# Patient Record
Sex: Male | Born: 1960
Health system: Southern US, Community
[De-identification: ages and names within clinical notes are randomized; demographics above are authoritative.]

## PROBLEM LIST (undated history)

## (undated) DIAGNOSIS — E7800 Pure hypercholesterolemia, unspecified: Secondary | ICD-10-CM

## (undated) DIAGNOSIS — H539 Unspecified visual disturbance: Secondary | ICD-10-CM

## (undated) DIAGNOSIS — I10 Essential (primary) hypertension: Secondary | ICD-10-CM

## (undated) HISTORY — PX: GANGLION CYST EXCISION: SHX1691

## (undated) HISTORY — DX: Essential (primary) hypertension: I10

## (undated) HISTORY — DX: Unspecified visual disturbance: H53.9

## (undated) HISTORY — PX: OTHER SURGICAL HISTORY: SHX169

## (undated) HISTORY — DX: Pure hypercholesterolemia, unspecified: E78.00

---

## 2003-12-04 ENCOUNTER — Ambulatory Visit: Payer: Self-pay | Admitting: Gastroenterology

## 2004-03-18 ENCOUNTER — Ambulatory Visit: Payer: Self-pay | Admitting: Internal Medicine

## 2004-04-01 ENCOUNTER — Ambulatory Visit: Payer: Self-pay

## 2004-04-08 ENCOUNTER — Ambulatory Visit: Payer: Self-pay | Admitting: Internal Medicine

## 2004-04-15 ENCOUNTER — Ambulatory Visit (HOSPITAL_COMMUNITY): Admission: RE | Admit: 2004-04-15 | Discharge: 2004-04-16 | Payer: Self-pay | Admitting: Internal Medicine

## 2004-04-15 ENCOUNTER — Ambulatory Visit: Payer: Self-pay | Admitting: Internal Medicine

## 2004-04-19 ENCOUNTER — Ambulatory Visit: Payer: Self-pay | Admitting: Cardiology

## 2004-04-19 ENCOUNTER — Ambulatory Visit: Payer: Self-pay

## 2004-04-25 ENCOUNTER — Ambulatory Visit: Payer: Self-pay | Admitting: Cardiology

## 2004-06-03 ENCOUNTER — Ambulatory Visit: Payer: Self-pay | Admitting: Internal Medicine

## 2004-06-03 ENCOUNTER — Ambulatory Visit (HOSPITAL_COMMUNITY): Admission: RE | Admit: 2004-06-03 | Discharge: 2004-06-03 | Payer: Self-pay | Admitting: Internal Medicine

## 2005-05-03 ENCOUNTER — Ambulatory Visit (HOSPITAL_COMMUNITY): Admission: RE | Admit: 2005-05-03 | Discharge: 2005-05-03 | Payer: Self-pay | Admitting: Internal Medicine

## 2005-10-03 ENCOUNTER — Encounter (HOSPITAL_COMMUNITY): Admission: RE | Admit: 2005-10-03 | Discharge: 2005-10-14 | Payer: Self-pay | Admitting: Psychiatry

## 2005-10-17 ENCOUNTER — Encounter (HOSPITAL_COMMUNITY): Admission: RE | Admit: 2005-10-17 | Discharge: 2005-11-16 | Payer: Self-pay | Admitting: Psychiatry

## 2005-11-17 ENCOUNTER — Ambulatory Visit: Admission: RE | Admit: 2005-11-17 | Discharge: 2005-11-17 | Payer: Self-pay | Admitting: Psychiatry

## 2007-01-17 ENCOUNTER — Encounter: Payer: Self-pay | Admitting: Family Medicine

## 2007-01-29 ENCOUNTER — Ambulatory Visit (HOSPITAL_COMMUNITY): Admission: RE | Admit: 2007-01-29 | Discharge: 2007-01-29 | Payer: Self-pay | Admitting: Family Medicine

## 2007-07-17 ENCOUNTER — Emergency Department (HOSPITAL_COMMUNITY): Admission: EM | Admit: 2007-07-17 | Discharge: 2007-07-17 | Payer: Self-pay | Admitting: Emergency Medicine

## 2007-07-19 ENCOUNTER — Ambulatory Visit (HOSPITAL_COMMUNITY): Payer: Self-pay | Admitting: *Deleted

## 2007-07-19 ENCOUNTER — Encounter (HOSPITAL_COMMUNITY): Admission: RE | Admit: 2007-07-19 | Discharge: 2007-08-18 | Payer: Self-pay | Admitting: *Deleted

## 2007-07-22 ENCOUNTER — Ambulatory Visit (HOSPITAL_COMMUNITY): Payer: Self-pay | Admitting: Family Medicine

## 2009-06-18 ENCOUNTER — Encounter (INDEPENDENT_AMBULATORY_CARE_PROVIDER_SITE_OTHER): Payer: Self-pay

## 2010-02-15 NOTE — Letter (Signed)
Summary: Recall, Screening Colonoscopy Only  [facility]  [address]   [place], [place] [address]   Phone: [phone]  Fax: [phone]    June 18, 2009  ZYDEN SUMAN [address] [place], [place]  [address] [birth year]   Dear Mr. Kovich,   Our records indicate it is time to schedule your colonoscopy.   Please call our office at [phone] and ask for the nurse.   Thank you,    Hendricks Limes, LPN Cloria Spring, LPN  [facility] Ph: [phone]   Fax: [phone]

## 2010-02-17 NOTE — Letter (Signed)
Summary: Historic Patient File  Historic Patient File   Imported By: Lind Guest 01/21/2010 16:26:04  _____________________________________________________________________  External Attachment:    Type:   Image     Comment:   External Document

## 2010-06-03 NOTE — Discharge Summary (Signed)
NAME:  Ruben Allen, Ruben Allen NO.:  [id number]   MEDICAL RECORD NO.:  [id number]          PATIENT TYPE:  OIB   LOCATION:  2896                         FACILITY:  [facility]   PHYSICIAN:  Maple Mirza, P.A. DATE OF BIRTH:  March 30, 1960   DATE OF ADMISSION:  04/15/2004  DATE OF DISCHARGE:  04/16/2004                                 DISCHARGE SUMMARY   PRIMARY CARE PHYSICIAN:  Madelin Rear. Fusco, MD   DISCHARGE DIAGNOSES:  1.  Status post radiofrequency catheter ablation of a AVRT with left lateral      accessory pathway.  2.  History of long-term (greater than 10 years) history of      tachypalpitations becoming more prolonged and more frequent with      symptoms of chest discomfort radiating to the neck and arms, shortness      of breath, and presyncope.  3.  Mid systolic click with occasional 2-D echocardiogram.   SECONDARY DIAGNOSES:  1.  Excision of breast mass, 1979.  2.  Resection of ganglion cyst on the wrist.  3.  Borderline hypertension.   PROCEDURES:  1.  2-D echocardiogram on March 18, 2004, with ejection fraction normal,      heart size normal, mitral valve leaflets are normal with no stenosis,      flutter, or prolapse.  2.  Stress Myoview on April 01, 2004, with ejection fraction of 54%. No      ischemia.  3.  On April 15, 2004, radiofrequency catheter ablation of AVRT with left      lateral accessory pathway, Dr. Duke Salvia.   DISCHARGE DISPOSITION:  The patient discharged on post procedure day #1  after observation. He has had no complaints in the post procedure period. He  has maintained sinus rhythm. His groin checks have been normal without  swelling, hematoma, drainage, or pain. The patient's vital signs have been  stable achieving 96% oxygen saturation on room air. The patient discharged  on his preadmission medications. He will continue on medications he was  taking prior to this admission.  He has a follow-up appointment with Dr.  Graciela Husbands at  [facility], [facility] office, [address], on  Tuesday, May 17, 2004, at 9:30 a.m.  He is asked not to drive for the next  two days, to avoid heavy lifting or straining for the next two weeks. He may  shower. He is to call [facility] at [phone] if he experiences pain,  swelling, or drainage at his catheterization sites.   DISCHARGE DIET:  Low sodium, low cholesterol diet.   BRIEF HISTORY:  Ruben Allen is a 50 year old male who has had recurrent  episodes of tachypalpitations with abrupt onset and offset. These have been  going on for about ten years and have become increasingly more symptomatic.  His symptoms include chest discomfort with radiation of the discomfort into  the neck and arms, and exertional shortness of breath. They occur 20 to 50  times per years, some times lasting one hour and some times 48 hours. He has  presyncopal feelings of lightheadedness with this. They  are FROG positive  and diuretic negative. He does not notice any effect with caffeine and does  not use over-the-counter cold medications. Electrocardiogram on the day of  examination, March 3rd, showed sinus rhythm at 70 beats per minute.  Impression is that the patient has a supraventricular tachycardia with  probable AV node re-entry based on his symptoms, since the symptoms are  becoming more pronounced and the episodes more frequent and prolonged,  definitive therapy makes good sense. Potential benefits and risks of  radiofrequency catheter ablation will be discussed with the patient and he  wishes to proceed with this study. Prior to this study an echocardiogram  will be obtained to image the mitral valve and a stress test will be  performed.   HOSPITAL COURSE:  The patient presents electively on March 31st after  undergoing an echocardiogram which showed a mitral valve with normal  function, a stress test with no ischemia, and an ejection fraction of 54%.  He underwent  radiofrequency catheter ablation of an AV re-entry tachycardia  with left lateral pathway. Dr. Graciela Husbands did successful radiofrequency catheter  ablation of this. The patient is ready for discharge on post procedure day  #1 with no post procedure complications.      GM/MEDQ  D:  04/15/2004  T:  04/15/2004  Job:  [id number]   cc:   Duke Salvia, M.D.   Madelin Rear. Sherwood Gambler, MD  [address]  [place]  [place] [address]  Fax: [phone]

## 2010-06-03 NOTE — Op Note (Signed)
NAME:  Ruben Allen, Ruben Allen              ACCOUNT NO.:  [id number]   MEDICAL RECORD NO.:  [id number]          PATIENT TYPE:  AMB   LOCATION:  DAY                           FACILITY:  [facility]   PHYSICIAN:  Lionel December, M.D.    DATE OF BIRTH:  1960-07-14   DATE OF PROCEDURE:  06/03/2004  DATE OF DISCHARGE:                                 OPERATIVE REPORT   PROCEDURE:  Colonoscopy.   INDICATIONS:  [name] is a 50 year old Caucasian male with intermittent  hematochezia felt to be secondary to  hemorrhoids.  However, he also has  irregular bowel movements.  Some days he has diarrhea.  Family history is  significant for colon carcinoma in his father, who died because of it at age  71.  His sister, age 65, recently had colonoscopy and had 10 polyps removed.  The procedure risks were reviewed the patient, informed consent was  obtained.   PREMEDICATION:  Demerol 50 mg IV, Versed 6 mg IV in divided dose.   FINDINGS:  Procedure performed in endoscopy suite.  The patient's vital  signs and O2 saturation were monitored during procedure and remained stable.  The patient was placed in the left lateral recumbent position and rectal  examination performed.  No abnormality noted on external or digital exam.  The Olympus video scope was placed in the rectum and advanced under vision  into sigmoid colon and beyond.  Preparation was satisfactory.  The scope was  passed to the cecum, which was identified by appendiceal orifice and  ileocecal valve.  Pictures taken for the record.  A short segment of TI was  also examined and was normal.  Colonic mucosa was examined for the second  time on the way out, and there were no polyps and/or tumor masses.  A single  small diverticulum and was noted at distal sigmoid colon.  Rectal mucosa was  normal.  The scope was retroflexed to examine the anorectal junction and  small hemorrhoids were noted below the dentate line.  Endoscope was  straightened and withdrawn.  The  patient tolerated the procedure well.   FINAL DIAGNOSIS:  1.  Single small diverticulum at sigmoid colon and small external      hemorrhoids, otherwise normal examination.  2.  Suspect hematochezia secondary hemorrhoids and feels he has irritable      bowel syndrome.   RECOMMENDATIONS:  1.  High-fiber diet.  2.  Citrucel 4 g q.d. or equivalent.  3.  The patient should consider having a repeat exam in five years from now.      NR/MEDQ  D:  06/03/2004  T:  06/03/2004  Job:  [id number]   cc:   Madelin Rear. Sherwood Gambler, MD  [address]  [place]  [place] [address]  Fax: [phone]

## 2010-07-04 ENCOUNTER — Other Ambulatory Visit (HOSPITAL_COMMUNITY): Payer: Self-pay | Admitting: Pulmonary Disease

## 2010-07-04 DIAGNOSIS — M549 Dorsalgia, unspecified: Secondary | ICD-10-CM

## 2010-07-05 ENCOUNTER — Ambulatory Visit (HOSPITAL_COMMUNITY)
Admission: RE | Admit: 2010-07-05 | Discharge: 2010-07-05 | Disposition: A | Discharge status: Home / Self Care | Payer: 59 | Source: Ambulatory Visit | Attending: Pulmonary Disease | Admitting: Pulmonary Disease

## 2010-07-05 DIAGNOSIS — M5020 Other cervical disc displacement, unspecified cervical region: Secondary | ICD-10-CM | POA: Insufficient documentation

## 2010-07-05 DIAGNOSIS — M79609 Pain in unspecified limb: Secondary | ICD-10-CM | POA: Insufficient documentation

## 2010-07-05 DIAGNOSIS — M542 Cervicalgia: Secondary | ICD-10-CM | POA: Insufficient documentation

## 2010-07-05 DIAGNOSIS — M5380 Other specified dorsopathies, site unspecified: Secondary | ICD-10-CM | POA: Insufficient documentation

## 2010-07-05 DIAGNOSIS — M549 Dorsalgia, unspecified: Secondary | ICD-10-CM | POA: Insufficient documentation

## 2010-07-05 DIAGNOSIS — R209 Unspecified disturbances of skin sensation: Secondary | ICD-10-CM | POA: Insufficient documentation

## 2011-01-04 ENCOUNTER — Other Ambulatory Visit (HOSPITAL_COMMUNITY): Payer: Self-pay | Admitting: Pulmonary Disease

## 2011-01-04 ENCOUNTER — Ambulatory Visit (HOSPITAL_COMMUNITY)
Admission: RE | Admit: 2011-01-04 | Discharge: 2011-01-04 | Disposition: A | Discharge status: Home / Self Care | Payer: 59 | Source: Ambulatory Visit | Attending: Pulmonary Disease | Admitting: Pulmonary Disease

## 2011-01-04 DIAGNOSIS — M79609 Pain in unspecified limb: Secondary | ICD-10-CM | POA: Insufficient documentation

## 2011-01-04 DIAGNOSIS — M79601 Pain in right arm: Secondary | ICD-10-CM

## 2012-04-03 ENCOUNTER — Ambulatory Visit (HOSPITAL_COMMUNITY)
Admission: RE | Admit: 2012-04-03 | Discharge: 2012-04-03 | Disposition: A | Discharge status: Home / Self Care | Payer: Managed Care, Other (non HMO) | Source: Ambulatory Visit | Attending: Family Medicine | Admitting: Family Medicine

## 2012-04-03 ENCOUNTER — Other Ambulatory Visit (HOSPITAL_COMMUNITY): Payer: Self-pay | Admitting: Family Medicine

## 2012-04-03 DIAGNOSIS — R059 Cough, unspecified: Secondary | ICD-10-CM | POA: Insufficient documentation

## 2012-04-03 DIAGNOSIS — R5381 Other malaise: Secondary | ICD-10-CM | POA: Insufficient documentation

## 2013-01-30 ENCOUNTER — Encounter (INDEPENDENT_AMBULATORY_CARE_PROVIDER_SITE_OTHER): Payer: Self-pay | Admitting: *Deleted

## 2013-01-31 ENCOUNTER — Ambulatory Visit (HOSPITAL_COMMUNITY)
Admission: RE | Admit: 2013-01-31 | Discharge: 2013-01-31 | Disposition: A | Discharge status: Home / Self Care | Payer: BC Managed Care – PPO | Source: Ambulatory Visit | Attending: Family Medicine | Admitting: Family Medicine

## 2013-01-31 ENCOUNTER — Other Ambulatory Visit (HOSPITAL_COMMUNITY): Payer: Self-pay | Admitting: Family Medicine

## 2013-01-31 DIAGNOSIS — M545 Low back pain, unspecified: Secondary | ICD-10-CM

## 2013-01-31 DIAGNOSIS — M533 Sacrococcygeal disorders, not elsewhere classified: Secondary | ICD-10-CM | POA: Insufficient documentation

## 2013-02-13 ENCOUNTER — Telehealth (INDEPENDENT_AMBULATORY_CARE_PROVIDER_SITE_OTHER): Payer: Self-pay | Admitting: *Deleted

## 2013-02-13 ENCOUNTER — Ambulatory Visit (INDEPENDENT_AMBULATORY_CARE_PROVIDER_SITE_OTHER): Payer: BC Managed Care – PPO | Admitting: Internal Medicine

## 2013-02-13 ENCOUNTER — Encounter (INDEPENDENT_AMBULATORY_CARE_PROVIDER_SITE_OTHER): Payer: Self-pay | Admitting: Internal Medicine

## 2013-02-13 ENCOUNTER — Other Ambulatory Visit (INDEPENDENT_AMBULATORY_CARE_PROVIDER_SITE_OTHER): Payer: Self-pay | Admitting: *Deleted

## 2013-02-13 VITALS — BP 128/66 | HR 70 | Temp 97.4°F | Ht 68.0 in | Wt 175.2 lb

## 2013-02-13 DIAGNOSIS — Z800 Family history of malignant neoplasm of digestive organs: Secondary | ICD-10-CM

## 2013-02-13 DIAGNOSIS — Z1211 Encounter for screening for malignant neoplasm of colon: Secondary | ICD-10-CM

## 2013-02-13 DIAGNOSIS — R195 Other fecal abnormalities: Secondary | ICD-10-CM

## 2013-02-13 DIAGNOSIS — K625 Hemorrhage of anus and rectum: Secondary | ICD-10-CM

## 2013-02-13 MED ORDER — PEG-KCL-NACL-NASULF-NA ASC-C 100 G PO SOLR: 1.0000 | Freq: Once | ORAL | Status: DC

## 2013-02-13 NOTE — Telephone Encounter (Signed)
Patient needs movi prep 

## 2013-02-13 NOTE — Patient Instructions (Signed)
Colonoscopy with Dr. Rehman. The risks and benefits such as perforation, bleeding, and infection were reviewed with the patient and is agreeable. 

## 2013-02-13 NOTE — Progress Notes (Signed)
Subjective:     Patient ID: Ruben Allen, male   DOB: [birth year], 53 y.o.   MRN: [id number]  HPI Referred to our office by Dr. Gerarda Fraction, [place] for rectal bleeding, change in stool caliber, and strong family hx of colon cancer. He has had rectal bleeding a couple of times.  He also says his stools are completely flat. Sometimes his stools look like rabbit pellets. He has not had a normal stools in a couple of months.  Sometimes he has lower abdomin pain with his stools.  He has a lot of gas. Symptoms x 3-4 months. Appetite is okay. He has been taking Prednisone. He had some weight loss 10-15 pounds over a few months. Weight loss was unintentional.   He usually has a BM 2-3 days. He use to have a BM on a daily basis.  No recent rectal bleeding. No melena.  01/21/2013 H and H 16.1 and 45.0, MCV 85.1, Platelet Ct 33   Father deceased from colon cancer at age 60. Patient's older brother age 68s (1/2 brother) just diagnosed with colon cancer. Fecal occult blood negative in Dr. Nolon Rod office 01/21/2013 06/03/2004 Dr. Laural Golden Colonoscopy: rectal bleeding, family hx of colon cancer: FINAL DIAGNOSIS:  1. Single small diverticulum at sigmoid colon and small external  hemorrhoids, otherwise normal examination.  2. Suspect hematochezia secondary hemorrhoids and feels he has irritable  bowel syndrome.    Review of Systems see hpi Past Medical History  Diagnosis Date  . High cholesterol   . Hypertension     x 5 yrs   No past surgical history on file. No Known Allergies No current outpatient prescriptions on file prior to visit.   No current facility-administered medications on file prior to visit.   Current outpatient prescriptions:aspirin 81 MG tablet, Take 81 mg by mouth daily., Disp: , Rfl: ;  cholecalciferol (VITAMIN D) 1000 UNITS tablet, Take 1,000 Units by mouth 2 (two) times daily before a meal., Disp: , Rfl: ;  citalopram (CELEXA) 20 MG tablet, Take 20 mg by mouth daily., Disp: ,  Rfl: ;  hydrochlorothiazide (HYDRODIURIL) 25 MG tablet, Take 25 mg by mouth daily., Disp: , Rfl:  loratadine (CLARITIN) 10 MG tablet, Take 10 mg by mouth daily., Disp: , Rfl: ;  pravastatin (PRAVACHOL) 20 MG tablet, Take 20 mg by mouth daily., Disp: , Rfl: ;  vitamin C (ASCORBIC ACID) 500 MG tablet, Take 500 mg by mouth daily., Disp: , Rfl:    Married. Works at [employer] in [place] Two children in good health.      Objective:   Physical Exam  Filed Vitals:   02/13/13 1015  BP: 128/66  Pulse: 70  Temp: 97.4 F (36.3 C)  Height: 5\' 8"  (1.727 m)  Weight: 175 lb 3.2 oz (79.47 kg)  Alert and oriented. Skin warm and dry. Oral mucosa is moist.   . Sclera anicteric, conjunctivae is pink. Thyroid not enlarged. No cervical lymphadenopathy. Lungs clear. Heart regular rate and rhythm.  Abdomen is soft. Bowel sounds are positive. No hepatomegaly. No abdominal masses felt. No tenderness.  No edema to lower extremities.  Rectal deferred. Patient will be scheduled for a colonoscopy      Assessment:    Change in stool, Rectal bleeding. Strong family hx of colon cancer. Rectal carcinoma needs to be ruled out.     Plan:    Colonoscopy with Dr. Laural Golden. The risks and benefits such as perforation, bleeding, and infection were reviewed with the patient and  is agreeable.

## 2013-02-19 ENCOUNTER — Ambulatory Visit (HOSPITAL_COMMUNITY)
Admission: RE | Admit: 2013-02-19 | Discharge: 2013-02-19 | Disposition: A | Discharge status: Home / Self Care | Payer: BC Managed Care – PPO | Source: Ambulatory Visit | Attending: Internal Medicine | Admitting: Internal Medicine

## 2013-02-19 ENCOUNTER — Encounter (HOSPITAL_COMMUNITY)
Admission: RE | Disposition: A | Discharge status: Home / Self Care | Payer: Self-pay | Source: Ambulatory Visit | Attending: Internal Medicine

## 2013-02-19 ENCOUNTER — Encounter (HOSPITAL_COMMUNITY): Payer: Self-pay | Admitting: *Deleted

## 2013-02-19 DIAGNOSIS — D126 Benign neoplasm of colon, unspecified: Secondary | ICD-10-CM | POA: Insufficient documentation

## 2013-02-19 DIAGNOSIS — K625 Hemorrhage of anus and rectum: Secondary | ICD-10-CM

## 2013-02-19 DIAGNOSIS — I10 Essential (primary) hypertension: Secondary | ICD-10-CM | POA: Insufficient documentation

## 2013-02-19 DIAGNOSIS — K644 Residual hemorrhoidal skin tags: Secondary | ICD-10-CM | POA: Insufficient documentation

## 2013-02-19 DIAGNOSIS — K921 Melena: Secondary | ICD-10-CM | POA: Insufficient documentation

## 2013-02-19 DIAGNOSIS — R198 Other specified symptoms and signs involving the digestive system and abdomen: Secondary | ICD-10-CM

## 2013-02-19 DIAGNOSIS — Z79899 Other long term (current) drug therapy: Secondary | ICD-10-CM | POA: Insufficient documentation

## 2013-02-19 DIAGNOSIS — IMO0002 Reserved for concepts with insufficient information to code with codable children: Secondary | ICD-10-CM | POA: Insufficient documentation

## 2013-02-19 DIAGNOSIS — Z7982 Long term (current) use of aspirin: Secondary | ICD-10-CM | POA: Insufficient documentation

## 2013-02-19 DIAGNOSIS — Z800 Family history of malignant neoplasm of digestive organs: Secondary | ICD-10-CM

## 2013-02-19 DIAGNOSIS — R195 Other fecal abnormalities: Secondary | ICD-10-CM

## 2013-02-19 DIAGNOSIS — K5730 Diverticulosis of large intestine without perforation or abscess without bleeding: Secondary | ICD-10-CM | POA: Insufficient documentation

## 2013-02-19 HISTORY — PX: COLONOSCOPY: SHX5424

## 2013-02-19 SURGERY — COLONOSCOPY
Anesthesia: Moderate Sedation

## 2013-02-19 MED ORDER — SODIUM CHLORIDE 0.9 % IV SOLN
INTRAVENOUS | Status: DC
  Administered 2013-02-19: 1000 mL via INTRAVENOUS

## 2013-02-19 MED ORDER — DICYCLOMINE HCL 10 MG PO CAPS: 10.0000 mg | ORAL_CAPSULE | Freq: Two times a day (BID) | ORAL | Status: DC

## 2013-02-19 MED ORDER — MEPERIDINE HCL 50 MG/ML IJ SOLN
INTRAMUSCULAR | Status: DC | PRN
  Administered 2013-02-19 (×2): 25 mg via INTRAVENOUS

## 2013-02-19 MED ORDER — MIDAZOLAM HCL 5 MG/5ML IJ SOLN
INTRAMUSCULAR | Status: AC
  Filled 2013-02-19: qty 10

## 2013-02-19 MED ORDER — SIMETHICONE 40 MG/0.6ML PO SUSP
ORAL | Status: DC | PRN
  Administered 2013-02-19: 16:00:00

## 2013-02-19 MED ORDER — MEPERIDINE HCL 50 MG/ML IJ SOLN
INTRAMUSCULAR | Status: DC
Start: 2013-02-19 — End: 2013-02-19
  Filled 2013-02-19: qty 1

## 2013-02-19 MED ORDER — PSYLLIUM 28 % PO PACK: 1.0000 | PACK | Freq: Every day | ORAL | Status: DC

## 2013-02-19 MED ORDER — MIDAZOLAM HCL 5 MG/5ML IJ SOLN
INTRAMUSCULAR | Status: DC | PRN
  Administered 2013-02-19 (×2): 3 mg via INTRAVENOUS
  Administered 2013-02-19 (×2): 2 mg via INTRAVENOUS

## 2013-02-19 NOTE — Op Note (Signed)
COLONOSCOPY PROCEDURE REPORT  PATIENT:  Ruben Allen  MR#:  [id number] Birthdate:  08/14/1960, 53 y.o., male Endoscopist:  Dr. Rogene Houston, MD Referred By:  Dr. Sherrilee Gilles. Gerarda Fraction, MD Procedure Date: 02/19/2013  Procedure:   Colonoscopy  Indications:  Patient is 53 year old Caucasian male who presents with change in caliber of stools irregular bowel movements and hematochezia. Family history significant for colon carcinoma in his father at age 57 and half brother in his early 36s. Patient's last colonoscopy was in May 2006.  Informed Consent:  The procedure and risks were reviewed with the patient and informed consent was obtained.  Medications:  Demerol 50 mg IV Versed 10 mg IV  Description of procedure:  After a digital rectal exam was performed, that colonoscope was advanced from the anus through the rectum and colon to the area of the cecum, ileocecal valve and appendiceal orifice. The cecum was deeply intubated. These structures were well-seen and photographed for the record. From the level of the cecum and ileocecal valve, the scope was slowly and cautiously withdrawn. The mucosal surfaces were carefully surveyed utilizing scope tip to flexion to facilitate fold flattening as needed. The scope was pulled down into the rectum where a thorough exam including retroflexion was performed.  Findings:   Prep excellent. Small polyps ablated via cold biopsy from mid sigmoid colon. Few small diverticula at sigmoid colon. Normal rectal mucosa. Small hemorrhoids below the dentate line.   Therapeutic/Diagnostic Maneuvers Performed:  See above  Complications:  None  Cecal Withdrawal Time:  8 minutes  Impression:  Examination performed to cecum. Small sigmoid colon ablated via cold biopsy. Mild sigmoid colon diverticulosis. Small external hemorrhoids.  Comment; He has irritable bowel syndrome and hematochezia secondary to hemorrhoids.  Recommendations:  Standard instructions  given. Dicyclomine 10 mg by mouth twice a day. Metamucil 4 g by mouth each bedtime. I will contact patient with biopsy results and further recommendations.  Chemika Nightengale U  02/19/2013 4:11 PM  CC: Dr. Glo Herring., MD & Dr. Rayne Du ref. provider found

## 2013-02-19 NOTE — Discharge Instructions (Signed)
Resume usual medications and high fiber diet. Dicyclomine 10 mg by mouth before breakfast and evening meal daily. Metamucil 4 g by mouth daily at bedtime. No driving for 24 hours. Physician will contact you with biopsy results. Colonoscopy, Care After Refer to this sheet in the next few weeks. These instructions provide you with information on caring for yourself after your procedure. Your health care provider may also give you more specific instructions. Your treatment has been planned according to current medical practices, but problems sometimes occur. Call your health care provider if you have any problems or questions after your procedure. WHAT TO EXPECT AFTER THE PROCEDURE  After your procedure, it is typical to have the following:  A small amount of blood in your stool.  Moderate amounts of gas and mild abdominal cramping or bloating. HOME CARE INSTRUCTIONS  Do not drive, operate machinery, or sign important documents for 24 hours.  You may shower and resume your regular physical activities, but move at a slower pace for the first 24 hours.  Take frequent rest periods for the first 24 hours.  Walk around or put a warm pack on your abdomen to help reduce abdominal cramping and bloating.  Drink enough fluids to keep your urine clear or pale yellow.  You may resume your normal diet as instructed by your health care provider. Avoid heavy or fried foods that are hard to digest.  Avoid drinking alcohol for 24 hours or as instructed by your health care provider.  Only take over-the-counter or prescription medicines as directed by your health care provider.  If a tissue sample (biopsy) was taken during your procedure:  Do not take aspirin or blood thinners for 7 days, or as instructed by your health care provider.  Do not drink alcohol for 7 days, or as instructed by your health care provider.  Eat soft foods for the first 24 hours. SEEK MEDICAL CARE IF: You have persistent  spotting of blood in your stool 2 3 days after the procedure. SEEK IMMEDIATE MEDICAL CARE IF:  You have more than a small spotting of blood in your stool.  You pass large blood clots in your stool.  Your abdomen is swollen (distended).  You have nausea or vomiting.  You have a fever.  You have increasing abdominal pain that is not relieved with medicine. Document Released: 08/17/2003 Document Revised: 10/23/2012 Document Reviewed: 09/09/2012 [facility] Patient Information 2014 [place].

## 2013-02-19 NOTE — H&P (Signed)
Ruben Allen is an 53 y.o. male.   Chief Complaint: Patient is here for EGD. HPI: Patient is 53 year old Caucasian male sent change in his bowel habits. He is passing flat stools and other times he passes fecal balls. He has had few episodes of bright red blood per rectum his bowel movements. He has good appetite. He has lost 15 pounds in the last few months involuntarily. Patient's last colonoscopy was in May 2006 revealing single diverticulum in sigmoid colon external hemorrhoids. Family history significant for colon carcinoma in his father at age 29 and died of metastatic disease year later. He has a half brother(same father) was diagnosed with colon carcinoma. He is in his early 29s.  Past Medical History  Diagnosis Date  . High cholesterol   . Hypertension     x 5 yrs    Past Surgical History  Procedure Laterality Date  . Cardiac cath    . Thumb surgery    . Ganglion cyst excision Left     History reviewed. No pertinent family history. Social History:  reports that he has quit smoking. His smoking use included Cigarettes. He has a 25 pack-year smoking history. He quit smokeless tobacco use about 13 months ago. He reports that he drinks alcohol. He reports that he does not use illicit drugs.  Allergies: Not on File  Medications Prior to Admission  Medication Sig Dispense Refill  . aspirin 81 MG tablet Take 81 mg by mouth daily.      . Cholecalciferol (VITAMIN D) 2000 UNITS tablet Take 2,000 Units by mouth daily.      . citalopram (CELEXA) 20 MG tablet Take 20 mg by mouth daily.      . hydrochlorothiazide (HYDRODIURIL) 25 MG tablet Take 25 mg by mouth daily.      Marland Kitchen loratadine (CLARITIN) 10 MG tablet Take 10 mg by mouth daily.      . pravastatin (PRAVACHOL) 20 MG tablet Take 20 mg by mouth daily.      . predniSONE (DELTASONE) 10 MG tablet See admin instructions. Take 5 tablets day for 3 days, 4 tablets for 3 days, 3 tablets for 3 days, 2 tablets for 3 days then 1 tablet for 3  days      . vitamin C (ASCORBIC ACID) 500 MG tablet Take 500 mg by mouth daily.      Marland Kitchen levofloxacin (LEVAQUIN) 500 MG tablet Take 1 tablet by mouth daily. For 14 days        No results found for this or any previous visit (from the past 48 hour(s)). No results found.  ROS  Blood pressure 120/82, pulse 79, temperature 98.2 F (36.8 C), temperature source Oral, SpO2 98.00%. Physical Exam  Constitutional: He appears well-developed and well-nourished.  HENT:  Mouth/Throat: Oropharynx is clear and moist.  Eyes: Conjunctivae are normal. No scleral icterus.  Neck: No thyromegaly present.  Cardiovascular: Normal rate, regular rhythm and normal heart sounds.   No murmur heard. Respiratory: Effort normal and breath sounds normal.  GI: Soft. He exhibits no distension and no mass. Tenderness: mild tenderness at LLQ.  Musculoskeletal: He exhibits no edema.  Lymphadenopathy:    He has no cervical adenopathy.  Neurological: He is alert.  Skin: Skin is warm and dry.     Assessment/Plan Change in bowel habits and hematochezia. Family history of colon carcinoma in her father and half-brother. Diagnostic colonoscopy.  Haunani Dickard U 02/19/2013, 3:33 PM

## 2013-02-24 ENCOUNTER — Encounter (HOSPITAL_COMMUNITY): Payer: Self-pay | Admitting: Internal Medicine

## 2013-03-01 ENCOUNTER — Other Ambulatory Visit: Payer: Self-pay | Admitting: Internal Medicine

## 2013-03-01 MED ORDER — MERCAPTOPURINE 50 MG PO TABS: 100.0000 mg | ORAL_TABLET | Freq: Every day | ORAL | Status: DC

## 2013-03-03 ENCOUNTER — Telehealth (INDEPENDENT_AMBULATORY_CARE_PROVIDER_SITE_OTHER): Payer: Self-pay | Admitting: *Deleted

## 2013-03-03 NOTE — Telephone Encounter (Signed)
Ruben Allen had a TCS a couple of weeks ago. The medication Dr. Laural Golden put him on seems to be helping. His cramps have gotten better. He was advised to start taking fiber which has him going to the bathroom with lose stools 4 times daily. Would like to know if he soul continue taking the fiber? His return phone number is [phone].

## 2013-03-05 NOTE — Telephone Encounter (Signed)
Per Dr.Rehman the patient should take 3-4 grams of fiber daily. Patient was called and made aware.

## 2013-03-05 NOTE — Telephone Encounter (Signed)
I called Ruben Allen and ask that he take the 3 4- grams of fiber daily per Dr.Rehman. He states that he is taking Metamucil 0.52 grams , 1 by mouth at night. He says that the instructions on the bottle was to to take 5 by mouth daily. He goes on to say that Dr.Rehman ask that he take the packet form. Ruben Allen says that on yesterday he started rectal bleeding with each BM, it os fresh red blood and he feels that it may be a tear. Patient is going 4-5 times daily to the bath room with loose stool.  Patient was advised that Dr.Rehman would be made aware and we would call him back with his instructions. Patient uses the [facility] in [place].

## 2013-03-05 NOTE — Telephone Encounter (Signed)
Forwarded to Dr.Rehman to address. 

## 2013-03-06 NOTE — Telephone Encounter (Signed)
Patient's call return yesterday. He was advised to stop taking fiber. Will try probiotic daily for one month and call with progress report. Office visit in 6 months with me.

## 2013-03-10 ENCOUNTER — Encounter (INDEPENDENT_AMBULATORY_CARE_PROVIDER_SITE_OTHER): Payer: Self-pay | Admitting: *Deleted

## 2013-03-10 NOTE — Telephone Encounter (Signed)
LM for Damyan to return the call.

## 2013-03-12 NOTE — Telephone Encounter (Signed)
Apt has been scheduled for 09/09/13 with Dr. Laural Golden.

## 2013-06-24 ENCOUNTER — Encounter (INDEPENDENT_AMBULATORY_CARE_PROVIDER_SITE_OTHER): Payer: Self-pay

## 2013-08-24 ENCOUNTER — Other Ambulatory Visit (HOSPITAL_COMMUNITY): Payer: Self-pay | Admitting: Internal Medicine

## 2013-08-25 NOTE — Telephone Encounter (Signed)
Wife calls in and states he longer has insurance and would like # 60 with at least 2 refills.  I told her that should not be an issue if so we would call back.  Uses [facility]

## 2013-08-25 NOTE — Telephone Encounter (Signed)
May fill with 5 additional refills.

## 2013-09-08 ENCOUNTER — Telehealth (INDEPENDENT_AMBULATORY_CARE_PROVIDER_SITE_OTHER): Payer: Self-pay | Admitting: *Deleted

## 2013-09-08 NOTE — Telephone Encounter (Signed)
Chart noted and apt has been canceled

## 2013-09-08 NOTE — Telephone Encounter (Signed)
Wife stated she got call to remind of the appt on 8/25 and he will not be able to come because of ins.  She will call back to reschedule once he gets it

## 2013-09-09 ENCOUNTER — Ambulatory Visit (INDEPENDENT_AMBULATORY_CARE_PROVIDER_SITE_OTHER): Payer: BC Managed Care – PPO | Admitting: Internal Medicine

## 2015-05-11 ENCOUNTER — Ambulatory Visit (HOSPITAL_COMMUNITY)
Admission: RE | Admit: 2015-05-11 | Discharge: 2015-05-11 | Disposition: A | Discharge status: Home / Self Care | Payer: 59 | Source: Ambulatory Visit | Attending: Registered Nurse | Admitting: Registered Nurse

## 2015-05-11 ENCOUNTER — Other Ambulatory Visit (HOSPITAL_COMMUNITY): Payer: Self-pay | Admitting: Registered Nurse

## 2015-05-11 DIAGNOSIS — M47894 Other spondylosis, thoracic region: Secondary | ICD-10-CM | POA: Insufficient documentation

## 2015-05-11 DIAGNOSIS — M549 Dorsalgia, unspecified: Secondary | ICD-10-CM | POA: Diagnosis present

## 2016-03-10 ENCOUNTER — Other Ambulatory Visit (HOSPITAL_COMMUNITY): Payer: Self-pay | Admitting: Family Medicine

## 2016-03-10 DIAGNOSIS — R319 Hematuria, unspecified: Secondary | ICD-10-CM

## 2016-03-10 DIAGNOSIS — R109 Unspecified abdominal pain: Secondary | ICD-10-CM

## 2016-04-20 ENCOUNTER — Encounter (INDEPENDENT_AMBULATORY_CARE_PROVIDER_SITE_OTHER): Payer: Self-pay | Admitting: Internal Medicine

## 2016-05-02 ENCOUNTER — Encounter (INDEPENDENT_AMBULATORY_CARE_PROVIDER_SITE_OTHER): Payer: Self-pay

## 2016-05-02 ENCOUNTER — Ambulatory Visit (INDEPENDENT_AMBULATORY_CARE_PROVIDER_SITE_OTHER): Payer: Managed Care, Other (non HMO) | Admitting: Internal Medicine

## 2016-05-02 ENCOUNTER — Encounter (INDEPENDENT_AMBULATORY_CARE_PROVIDER_SITE_OTHER): Payer: Self-pay | Admitting: Internal Medicine

## 2016-05-02 VITALS — BP 158/90 | HR 56 | Temp 97.6°F | Ht 68.0 in | Wt 178.0 lb

## 2016-05-02 DIAGNOSIS — R3129 Other microscopic hematuria: Secondary | ICD-10-CM

## 2016-05-02 DIAGNOSIS — K5730 Diverticulosis of large intestine without perforation or abscess without bleeding: Secondary | ICD-10-CM

## 2016-05-02 DIAGNOSIS — K4030 Unilateral inguinal hernia, with obstruction, without gangrene, not specified as recurrent: Secondary | ICD-10-CM

## 2016-05-02 LAB — CBC WITH DIFFERENTIAL/PLATELET
BASOS ABS: 0 {cells}/uL (ref 0–200)
Basophils Relative: 0 %
EOS PCT: 3 %
Eosinophils Absolute: 273 cells/uL (ref 15–500)
HCT: 45.4 % (ref 38.5–50.0)
Hemoglobin: 15.5 g/dL (ref 13.2–17.1)
LYMPHS PCT: 22 %
Lymphs Abs: 2002 cells/uL (ref 850–3900)
MCH: 30.4 pg (ref 27.0–33.0)
MCHC: 34.1 g/dL (ref 32.0–36.0)
MCV: 89 fL (ref 80.0–100.0)
MPV: 12 fL (ref 7.5–12.5)
Monocytes Absolute: 637 cells/uL (ref 200–950)
Monocytes Relative: 7 %
Neutro Abs: 6188 cells/uL (ref 1500–7800)
Neutrophils Relative %: 68 %
Platelets: 252 10*3/uL (ref 140–400)
RBC: 5.1 MIL/uL (ref 4.20–5.80)
RDW: 13.1 % (ref 11.0–15.0)
WBC: 9.1 10*3/uL (ref 3.8–10.8)

## 2016-05-02 LAB — HEPATIC FUNCTION PANEL
ALBUMIN: 4.4 g/dL (ref 3.6–5.1)
ALT: 18 U/L (ref 9–46)
AST: 14 U/L (ref 10–35)
Alkaline Phosphatase: 56 U/L (ref 40–115)
Bilirubin, Direct: 0.1 mg/dL (ref <=0.2)
Indirect Bilirubin: 0.3 mg/dL (ref 0.2–1.2)
Total Bilirubin: 0.4 mg/dL (ref 0.2–1.2)
Total Protein: 6.8 g/dL (ref 6.1–8.1)

## 2016-05-02 LAB — URINALYSIS
Bilirubin Urine: NEGATIVE
GLUCOSE, UA: NEGATIVE
KETONES UR: NEGATIVE
Leukocytes, UA: NEGATIVE
Nitrite: NEGATIVE
PH: 6 (ref 5.0–8.0)
Protein, ur: NEGATIVE
SPECIFIC GRAVITY, URINE: 1.007 (ref 1.001–1.035)

## 2016-05-02 NOTE — Patient Instructions (Addendum)
OV in 1 year. If pain occurs, call our office. Labs today

## 2016-05-02 NOTE — Progress Notes (Signed)
Subjective:    Patient ID: Ruben Allen, male    DOB: [birth year], 56 y.o.   MRN: [id number]  HPI Referred by Dr. Gerarda Fraction for diverticulosis and inguinal hernia.  He says he has had pain in his rt flank for about a year or so. He says sometimes the pain is sharp an sometimes it is nagging. Nothing seems to provoke it.  He denies any fever. Appetite is good. No weight loss.  He has a BM x 1 a day and sometimes more.  No melena or BRRB.  He states sometimes he has some tenderness LLQ on palpatation.  Sometimes he has pain in his rt flank on movement.   He says he has had blood in his urine in the past (on urinalysis).   02/19/2013 Colonoscopy: Dr. Laural Golden: change in caliber of stools, irregular bowel movements and hematochezia. Family hx of colon cancer in father age 40 and half brother in his early 1s.  Examination performed to cecum. Small sigmoid colon ablated via cold biopsy. Mild sigmoid colon diverticulosis. Small external hemorrhoids.  Polyp is hyperplastic. Next colonoscopy in 5 years because of family history of CRC.  03/15/2016 Ct abdomen/pelvis with CM:   Result Narrative  CLINICAL DATA:Intermittent stabbing right flank pain for 1 month, microscopic hematuria       1. There is no evidence of nephrolithiasis. No hydronephrosis or hydroureter. No calcified ureteral calculi. 2. No calcified calculi are noted within urinary bladder. 3. Normal appendix.No pericecal inflammation. 4. No small bowel or colonic obstruction. 5. Scattered diverticula are noted descending colon and proximal sigmoid colon. No evidence of acute colitis or acute diverticulitis. 6. There is a left inguinal scrotal canal hernia containing fat measures 1.7 cm without evidence of acute complication.      Review of Systems Past Medical History:  Diagnosis Date  . High cholesterol   . Hypertension    x 5 yrs    Past Surgical History:  Procedure Laterality Date  . cardiac cath    .  COLONOSCOPY N/A 02/19/2013   Procedure: COLONOSCOPY;  Surgeon: Rogene Houston, MD;  Location: [facility];  Service: Endoscopy;  Laterality: N/A;  145-moved to 320 Ann to notify pt  . GANGLION CYST EXCISION Left   . Thumb surgery      No Known Allergies  Current Outpatient Prescriptions on File Prior to Visit  Medication Sig Dispense Refill  . aspirin 81 MG tablet Take 81 mg by mouth daily.    . citalopram (CELEXA) 20 MG tablet Take 20 mg by mouth daily.    Marland Kitchen dicyclomine (BENTYL) 10 MG capsule TAKE ONE CAPSULE BY MOUTH TWICE DAILY BEFORE MEAL(S) 60 capsule 5  . hydrochlorothiazide (HYDRODIURIL) 25 MG tablet Take 25 mg by mouth daily.    Marland Kitchen loratadine (CLARITIN) 10 MG tablet Take 10 mg by mouth daily.    . pravastatin (PRAVACHOL) 20 MG tablet Take 20 mg by mouth daily.    . vitamin C (ASCORBIC ACID) 500 MG tablet Take 500 mg by mouth daily.    . Cholecalciferol (VITAMIN D) 2000 UNITS tablet Take 2,000 Units by mouth daily.     No current facility-administered medications on file prior to visit.        Objective:   Physical Exam Blood pressure (!) 158/90, pulse (!) 56, temperature 97.6 F (36.4 C), height 5\' 8"  (1.727 m), weight 178 lb (80.7 kg).  Alert and oriented. Skin warm and dry. Oral mucosa is moist.   . Sclera  anicteric, conjunctivae is pink. Thyroid not enlarged. No cervical lymphadenopathy. Lungs clear. Heart regular rate and rhythm.  Abdomen is soft. Bowel sounds are positive. No hepatomegaly. No abdominal masses felt.Slight tenderness to LLQ on palpation (deep). .  No edema to lower extremities.  .      Assessment & Plan:  Diverticulosis. Am going to get a CBC, Hepatic   Hematuria: Urinalysis Left inguinal hernia. If symptomatic may need to see surgeon of choice.  OV in 1 year.

## 2017-02-07 ENCOUNTER — Other Ambulatory Visit (HOSPITAL_COMMUNITY): Payer: Self-pay | Admitting: Family Medicine

## 2017-02-07 ENCOUNTER — Ambulatory Visit (HOSPITAL_COMMUNITY)
Admission: RE | Admit: 2017-02-07 | Discharge: 2017-02-07 | Disposition: A | Discharge status: Home / Self Care | Payer: Managed Care, Other (non HMO) | Source: Ambulatory Visit | Attending: Family Medicine | Admitting: Family Medicine

## 2017-02-07 DIAGNOSIS — M5136 Other intervertebral disc degeneration, lumbar region: Secondary | ICD-10-CM | POA: Insufficient documentation

## 2017-02-07 DIAGNOSIS — R109 Unspecified abdominal pain: Secondary | ICD-10-CM | POA: Diagnosis not present

## 2017-02-07 DIAGNOSIS — R319 Hematuria, unspecified: Secondary | ICD-10-CM | POA: Diagnosis present

## 2017-02-07 DIAGNOSIS — K5730 Diverticulosis of large intestine without perforation or abscess without bleeding: Secondary | ICD-10-CM | POA: Insufficient documentation

## 2017-05-03 ENCOUNTER — Ambulatory Visit (INDEPENDENT_AMBULATORY_CARE_PROVIDER_SITE_OTHER): Payer: Managed Care, Other (non HMO) | Admitting: Internal Medicine

## 2017-05-08 ENCOUNTER — Ambulatory Visit (INDEPENDENT_AMBULATORY_CARE_PROVIDER_SITE_OTHER): Payer: Managed Care, Other (non HMO) | Admitting: Internal Medicine

## 2017-08-12 ENCOUNTER — Encounter (HOSPITAL_COMMUNITY): Payer: Self-pay | Admitting: *Deleted

## 2017-08-12 ENCOUNTER — Emergency Department (HOSPITAL_COMMUNITY): Payer: Managed Care, Other (non HMO)

## 2017-08-12 ENCOUNTER — Emergency Department (HOSPITAL_COMMUNITY)
Admission: EM | Admit: 2017-08-12 | Discharge: 2017-08-12 | Disposition: A | Discharge status: Home Health | Payer: Managed Care, Other (non HMO) | Attending: Emergency Medicine | Admitting: Emergency Medicine

## 2017-08-12 DIAGNOSIS — Z79899 Other long term (current) drug therapy: Secondary | ICD-10-CM | POA: Insufficient documentation

## 2017-08-12 DIAGNOSIS — R200 Anesthesia of skin: Secondary | ICD-10-CM

## 2017-08-12 DIAGNOSIS — Z7982 Long term (current) use of aspirin: Secondary | ICD-10-CM | POA: Insufficient documentation

## 2017-08-12 DIAGNOSIS — F17210 Nicotine dependence, cigarettes, uncomplicated: Secondary | ICD-10-CM | POA: Diagnosis not present

## 2017-08-12 DIAGNOSIS — R202 Paresthesia of skin: Secondary | ICD-10-CM | POA: Diagnosis present

## 2017-08-12 DIAGNOSIS — I10 Essential (primary) hypertension: Secondary | ICD-10-CM | POA: Diagnosis not present

## 2017-08-12 DIAGNOSIS — M792 Neuralgia and neuritis, unspecified: Secondary | ICD-10-CM | POA: Diagnosis not present

## 2017-08-12 MED ORDER — HYDROCODONE-ACETAMINOPHEN 5-325 MG PO TABS: 1.0000 | ORAL_TABLET | ORAL | 0 refills | Status: DC | PRN

## 2017-08-12 NOTE — Discharge Instructions (Addendum)
Your CT scan is negative today for any evidence of a stroke, tumor or other source of your symptoms.  As discussed, you may need an MRI if your symptoms persist or do not improve with the valtrex and gabapentin ( or you do not develop the expected rash which would confirm shingles).  You may take the hydrocodone prescribed for pain relief in addition to the medicines you are taking if needed for severe pain.  This will make you drowsy - do not drive within 4 hours of taking this medication.

## 2017-08-12 NOTE — ED Triage Notes (Signed)
Pt with sharp stabbing HA's and was seen by PCP on Friday, pt began to have numbness to left lip Friday, yesterday morning numbness got worse to left side of face, denies any other numbness elsewhere, denies weakness.

## 2017-08-12 NOTE — ED Notes (Signed)
Patient transported to CT 

## 2017-08-12 NOTE — ED Provider Notes (Signed)
[facility] EMERGENCY DEPARTMENT Provider Note   CSN: [id number] Arrival date & time: 08/12/17  2355     History   Chief Complaint Chief Complaint  Patient presents with  . Numbness    HPI Ruben Allen is a 57 y.o. male with a history of htn and hypercholesterolemia was seen by his pcp 2 days ago due to a one week history of intermittent stabs of pain which had become progressive, starting in his right ear with radiation across his parietal scalp to beyond midline when severe radiating to his posterior left eye area, worsened in the evening hours, lancing in character.  He was started on valtrex and gabapentin for presumptive shingles (no rash yet).  He also had a small area of numbness to his left lower lip which started 2 days ago and has progressed, now including his left tongue and cheek area "feels like novocaine".  He denies fevers, chills, current headache or pain, as he states he generally wakes up feeling ok, the pain starts in the afternoon and gets severe in the evening.  He denies any other focal weakness or numbness.  Denies vision or hearing changes, no dizziness, no difficulty with speech or swallowing.  He is unsure if the medicines prescribed are making a difference. He was nauseated last night when his pain was severe, not currently.  The history is provided by the patient.    Past Medical History:  Diagnosis Date  . High cholesterol   . Hypertension    x 5 yrs    Patient Active Problem List   Diagnosis Date Noted  . Change in stool 02/13/2013  . Rectal bleeding 02/13/2013  . Family hx of colon cancer 02/13/2013    Past Surgical History:  Procedure Laterality Date  . cardiac cath    . COLONOSCOPY N/A 02/19/2013   Procedure: COLONOSCOPY;  Surgeon: Rogene Houston, MD;  Location: [facility];  Service: Endoscopy;  Laterality: N/A;  145-moved to 320 Ann to notify pt  . GANGLION CYST EXCISION Left   . Thumb surgery          Home Medications    Prior  to Admission medications   Medication Sig Start Date End Date Taking? Authorizing Provider  amLODipine (NORVASC) 2.5 MG tablet Take 2.5 mg by mouth daily.   Yes [provider]  aspirin 81 MG tablet Take 81 mg by mouth daily.   Yes [provider]  citalopram (CELEXA) 20 MG tablet Take 20 mg by mouth daily.   Yes [provider]  dicyclomine (BENTYL) 10 MG capsule TAKE ONE CAPSULE BY MOUTH TWICE DAILY BEFORE MEAL(S)   Yes Rehman, Najeeb U, MD  gabapentin (NEURONTIN) 300 MG capsule Take 1 capsule by mouth 3 (three) times daily. 08/10/17  Yes [provider]  hydrochlorothiazide (HYDRODIURIL) 25 MG tablet Take 25 mg by mouth daily.   Yes [provider]  ibuprofen (ADVIL,MOTRIN) 200 MG tablet Take 200 mg by mouth daily.   Yes [provider]  loratadine (CLARITIN) 10 MG tablet Take 10 mg by mouth daily.   Yes [provider]  pravastatin (PRAVACHOL) 40 MG tablet Take 40 mg by mouth daily. 07/24/17  Yes [provider]  valACYclovir (VALTREX) 500 MG tablet Take 1 tablet by mouth 2 (two) times daily. Starting 08/10/2017 x 7 days. 08/10/17  Yes [provider]  vitamin C (ASCORBIC ACID) 500 MG tablet Take 500 mg by mouth daily.   Yes [provider]  HYDROcodone-acetaminophen (NORCO/VICODIN) 5-325 MG tablet Take 1 tablet by mouth every 4 (four) hours as needed. 08/12/17   Evalee Jefferson, PA-C    Family History History reviewed. No pertinent family history.  Social History Social History   Tobacco Use  . Smoking status: Current Every Day Smoker    Packs/day: 1.00    Years: 25.00    Pack years: 25.00    Types: Cigarettes  . Smokeless tobacco: Former Systems developer    Quit date: 01/16/2012  . Tobacco comment: quit a couple of years after smoking 65yrs.  Substance Use Topics  . Alcohol use: Yes    Comment: weekends  . Drug use: No     Allergies   Patient has no known allergies.   Review of Systems Review of  Systems  Constitutional: Negative for chills and fever.  HENT: Positive for ear pain. Negative for congestion and sore throat.   Eyes: Positive for pain.  Respiratory: Negative for chest tightness and shortness of breath.   Cardiovascular: Negative for chest pain.  Gastrointestinal: Positive for nausea. Negative for abdominal pain.  Genitourinary: Negative.   Musculoskeletal: Negative for arthralgias, joint swelling and neck pain.  Skin: Negative.  Negative for rash and wound.  Neurological: Positive for numbness and headaches. Negative for dizziness, weakness and light-headedness.  Psychiatric/Behavioral: Negative.      Physical Exam Updated Vital Signs BP 124/77   Pulse 66   Temp 97.6 F (36.4 C) (Oral)   Resp 15   Ht 5\' 8"  (1.727 m)   Wt 79.4 kg (175 lb)   SpO2 97%   BMI 26.61 kg/m   Physical Exam  Constitutional: He is oriented to person, place, and time. He appears well-developed and well-nourished.  HENT:  Head: Normocephalic and atraumatic.  Right Ear: External ear normal. Tympanic membrane is bulging.  Left Ear: External ear normal.  Nose: Nose normal.  Mouth/Throat: Uvula is midline, oropharynx is clear and moist and mucous membranes are normal. No trismus in the jaw. No oropharyngeal exudate, posterior oropharyngeal edema or posterior oropharyngeal erythema.  TM's clear, right TM with loss of landmarks, no effusion, no erythema.  Eyes: Conjunctivae are normal.  Neck: Normal range of motion.  Cardiovascular: Normal rate, regular rhythm, normal heart sounds and intact distal pulses.  Pulmonary/Chest: Effort normal and breath sounds normal. He has no wheezes.  Abdominal: Soft. Bowel sounds are normal. There is no tenderness.  Musculoskeletal: Normal range of motion.  Neurological: He is alert and oriented to person, place, and time. A sensory deficit is present. No cranial nerve deficit.  No facial droop.  Decreased sensation to fine touch left chin and left  perioral. Smile symmetric.  No tongue deviation.  Equal grip strength. No pronator drift. Cranial nerves otherwise without deficit.  Skin: Skin is warm and dry.  Psychiatric: He has a normal mood and affect.  Nursing note and vitals reviewed.    ED Treatments / Results  Labs (all labs ordered are listed, but only abnormal results are displayed) Labs Reviewed - No data to display  EKG EKG Interpretation  Date/Time:  Sunday August 12 2017 09:50:54 EDT Ventricular Rate:  69 PR Interval:    QRS Duration: 110 QT Interval:  429 QTC Calculation: 460 R Axis:   67 Text Interpretation:  Sinus rhythm Borderline T abnormalities, anterior leads Nonspecific T wave abnormality aVL When compared with ECG of 04/16/2004 Nonspecific T wave abnormality aVL is now Present Otherwise no significant change Confirmed by Francine Graven [phone]) on 08/12/2017 10:13:45  AM   Radiology Ct Head Wo Contrast  Result Date: 08/12/2017 CLINICAL DATA:  57 year old male with acute headache and lip numbness for 2 days. EXAM: CT HEAD WITHOUT CONTRAST TECHNIQUE: Contiguous axial images were obtained from the base of the skull through the vertex without intravenous contrast. COMPARISON:  05/03/2005 brain MR FINDINGS: Brain: No evidence of acute infarction, hemorrhage, hydrocephalus, extra-axial collection or mass lesion/mass effect. Vascular: No hyperdense vessel or unexpected calcification. Skull: Normal. Negative for fracture or focal lesion. Sinuses/Orbits: No acute finding. Other: None. IMPRESSION: Unremarkable noncontrast head CT. Electronically Signed   By: Margarette Canada M.D.   On: 08/12/2017 11:07    Procedures Procedures (including critical care time)  Medications Ordered in ED Medications - No data to display   Initial Impression / Assessment and Plan / ED Course  I have reviewed the triage vital signs and the nursing notes.  Pertinent labs & imaging results that were available during my care of the patient were  reviewed by me and considered in my medical decision making (see chart for details).     Pt with a one week history of right sided ear and head pain suggesting neuropathic pain with left sided perioral numbness. No mass effect, no subacute cva on CT imaging.  No neuro deficits on exam except for localized numbness.  Advised to continue meds prescribed by his pcp, added hydrocodone for pain relief.  Advised close f/u with pcp this week for persistent or worsened sx. Ddx including atypical new onset migraine, Bells palsy or trigeminal neuralgia, but again, atypical.  Likely shingles pending rash.   The patient appears reasonably screened and/or stabilized for discharge and I doubt any other medical condition or other [facility] requiring further screening, evaluation, or treatment in the ED at this time prior to discharge.   Final Clinical Impressions(s) / ED Diagnoses   Final diagnoses:  Nerve pain  Numbness    ED Discharge Orders        Ordered    HYDROcodone-acetaminophen (NORCO/VICODIN) 5-325 MG tablet  Every 4 hours PRN     08/12/17 1133       Evalee Jefferson, PA-C 08/12/17 [place], Dyer, DO 08/15/17 2120

## 2017-08-12 NOTE — ED Notes (Signed)
Gabapentin and acyclovir started on Friday due to numbness and stabbing pains to head and left ear

## 2017-08-19 ENCOUNTER — Emergency Department (HOSPITAL_COMMUNITY): Payer: Managed Care, Other (non HMO)

## 2017-08-19 ENCOUNTER — Other Ambulatory Visit (HOSPITAL_COMMUNITY): Payer: Managed Care, Other (non HMO)

## 2017-08-19 ENCOUNTER — Inpatient Hospital Stay (HOSPITAL_COMMUNITY)
Admission: EM | Admit: 2017-08-19 | Discharge: 2017-08-21 | DRG: 066 | Disposition: A | Discharge status: Home Health | Payer: Managed Care, Other (non HMO) | Attending: Internal Medicine | Admitting: Internal Medicine

## 2017-08-19 ENCOUNTER — Other Ambulatory Visit: Payer: Self-pay

## 2017-08-19 ENCOUNTER — Encounter (HOSPITAL_COMMUNITY): Payer: Self-pay | Admitting: Emergency Medicine

## 2017-08-19 DIAGNOSIS — F17210 Nicotine dependence, cigarettes, uncomplicated: Secondary | ICD-10-CM | POA: Diagnosis present

## 2017-08-19 DIAGNOSIS — B029 Zoster without complications: Secondary | ICD-10-CM | POA: Diagnosis present

## 2017-08-19 DIAGNOSIS — E785 Hyperlipidemia, unspecified: Secondary | ICD-10-CM

## 2017-08-19 DIAGNOSIS — I6381 Other cerebral infarction due to occlusion or stenosis of small artery: Principal | ICD-10-CM | POA: Diagnosis present

## 2017-08-19 DIAGNOSIS — I10 Essential (primary) hypertension: Secondary | ICD-10-CM | POA: Diagnosis present

## 2017-08-19 DIAGNOSIS — Z7982 Long term (current) use of aspirin: Secondary | ICD-10-CM

## 2017-08-19 DIAGNOSIS — E7800 Pure hypercholesterolemia, unspecified: Secondary | ICD-10-CM | POA: Diagnosis present

## 2017-08-19 DIAGNOSIS — F419 Anxiety disorder, unspecified: Secondary | ICD-10-CM | POA: Diagnosis present

## 2017-08-19 DIAGNOSIS — R200 Anesthesia of skin: Secondary | ICD-10-CM | POA: Insufficient documentation

## 2017-08-19 DIAGNOSIS — G379 Demyelinating disease of central nervous system, unspecified: Secondary | ICD-10-CM

## 2017-08-19 DIAGNOSIS — Z79899 Other long term (current) drug therapy: Secondary | ICD-10-CM

## 2017-08-19 DIAGNOSIS — G35 Multiple sclerosis: Secondary | ICD-10-CM | POA: Diagnosis present

## 2017-08-19 DIAGNOSIS — R29810 Facial weakness: Secondary | ICD-10-CM | POA: Diagnosis present

## 2017-08-19 DIAGNOSIS — Z720 Tobacco use: Secondary | ICD-10-CM

## 2017-08-19 DIAGNOSIS — Z800 Family history of malignant neoplasm of digestive organs: Secondary | ICD-10-CM

## 2017-08-19 DIAGNOSIS — I639 Cerebral infarction, unspecified: Secondary | ICD-10-CM

## 2017-08-19 DIAGNOSIS — R29700 NIHSS score 0: Secondary | ICD-10-CM | POA: Diagnosis present

## 2017-08-19 LAB — CBC WITH DIFFERENTIAL/PLATELET
Abs Immature Granulocytes: 0.1 10*3/uL (ref 0.0–0.1)
BASOS ABS: 0.1 10*3/uL (ref 0.0–0.1)
BASOS PCT: 1 %
EOS PCT: 1 %
Eosinophils Absolute: 0.2 10*3/uL (ref 0.0–0.7)
HCT: 49.8 % (ref 39.0–52.0)
HEMOGLOBIN: 16.5 g/dL (ref 13.0–17.0)
Immature Granulocytes: 1 %
LYMPHS PCT: 18 %
Lymphs Abs: 2.3 10*3/uL (ref 0.7–4.0)
MCH: 30.6 pg (ref 26.0–34.0)
MCHC: 33.1 g/dL (ref 30.0–36.0)
MCV: 92.4 fL (ref 78.0–100.0)
MONOS PCT: 7 %
Monocytes Absolute: 1 10*3/uL (ref 0.1–1.0)
Neutro Abs: 9.6 10*3/uL — ABNORMAL HIGH (ref 1.7–7.7)
Neutrophils Relative %: 72 %
Platelets: 241 10*3/uL (ref 150–400)
RBC: 5.39 MIL/uL (ref 4.22–5.81)
RDW: 12.6 % (ref 11.5–15.5)
WBC: 13.3 10*3/uL — ABNORMAL HIGH (ref 4.0–10.5)

## 2017-08-19 LAB — BASIC METABOLIC PANEL
Anion gap: 13 (ref 5–15)
BUN: 19 mg/dL (ref 6–20)
CO2: 29 mmol/L (ref 22–32)
CREATININE: 1.06 mg/dL (ref 0.61–1.24)
Calcium: 9.4 mg/dL (ref 8.9–10.3)
Chloride: 96 mmol/L — ABNORMAL LOW (ref 98–111)
GFR calc Af Amer: >60 mL/min (ref >60)
Glucose, Bld: 108 mg/dL — ABNORMAL HIGH (ref 70–99)
POTASSIUM: 3.7 mmol/L (ref 3.5–5.1)
SODIUM: 138 mmol/L (ref 135–145)

## 2017-08-19 MED ORDER — PRAVASTATIN SODIUM 40 MG PO TABS
40.0000 mg | ORAL_TABLET | Freq: Every day | ORAL | Status: DC
  Administered 2017-08-20 – 2017-08-21 (×2): 40 mg via ORAL
  Filled 2017-08-19 (×2): qty 1

## 2017-08-19 MED ORDER — HYDROCHLOROTHIAZIDE 25 MG PO TABS
25.0000 mg | ORAL_TABLET | Freq: Every day | ORAL | Status: DC
  Administered 2017-08-20 – 2017-08-21 (×2): 25 mg via ORAL
  Filled 2017-08-19 (×2): qty 1

## 2017-08-19 MED ORDER — PANTOPRAZOLE SODIUM 40 MG PO TBEC
40.0000 mg | DELAYED_RELEASE_TABLET | Freq: Every day | ORAL | Status: DC
  Administered 2017-08-20 – 2017-08-21 (×2): 40 mg via ORAL
  Filled 2017-08-19 (×2): qty 1

## 2017-08-19 MED ORDER — SODIUM CHLORIDE 0.9 % IV SOLN
1000.0000 mg | INTRAVENOUS | Status: DC
  Administered 2017-08-20 – 2017-08-21 (×2): 1000 mg via INTRAVENOUS
  Filled 2017-08-19 (×2): qty 8

## 2017-08-19 MED ORDER — AMLODIPINE BESYLATE 2.5 MG PO TABS
2.5000 mg | ORAL_TABLET | Freq: Every day | ORAL | Status: DC
  Administered 2017-08-20 – 2017-08-21 (×2): 2.5 mg via ORAL
  Filled 2017-08-19 (×2): qty 1

## 2017-08-19 MED ORDER — SODIUM CHLORIDE 0.9% FLUSH
3.0000 mL | Freq: Two times a day (BID) | INTRAVENOUS | Status: DC
  Administered 2017-08-19 – 2017-08-21 (×4): 3 mL via INTRAVENOUS

## 2017-08-19 MED ORDER — NICOTINE 21 MG/24HR TD PT24
21.0000 mg | MEDICATED_PATCH | Freq: Every day | TRANSDERMAL | Status: DC
  Administered 2017-08-19 – 2017-08-21 (×3): 21 mg via TRANSDERMAL
  Filled 2017-08-19 (×3): qty 1

## 2017-08-19 MED ORDER — CITALOPRAM HYDROBROMIDE 10 MG PO TABS
20.0000 mg | ORAL_TABLET | Freq: Every day | ORAL | Status: DC
  Administered 2017-08-20 – 2017-08-21 (×2): 20 mg via ORAL
  Filled 2017-08-19 (×2): qty 2

## 2017-08-19 MED ORDER — SODIUM CHLORIDE 0.9 % IV SOLN
1000.0000 mg | Freq: Once | INTRAVENOUS | Status: AC
  Administered 2017-08-19: 1000 mg via INTRAVENOUS
  Filled 2017-08-19: qty 8

## 2017-08-19 MED ORDER — ENOXAPARIN SODIUM 40 MG/0.4ML ~~LOC~~ SOLN
40.0000 mg | Freq: Every day | SUBCUTANEOUS | Status: DC
  Filled 2017-08-19: qty 0.4

## 2017-08-19 MED ORDER — GADOBENATE DIMEGLUMINE 529 MG/ML IV SOLN
15.0000 mL | Freq: Once | INTRAVENOUS | Status: AC | PRN
  Administered 2017-08-19: 15 mL via INTRAVENOUS

## 2017-08-19 MED ORDER — ASPIRIN EC 81 MG PO TBEC
81.0000 mg | DELAYED_RELEASE_TABLET | Freq: Every day | ORAL | Status: DC
  Administered 2017-08-20 – 2017-08-21 (×2): 81 mg via ORAL
  Filled 2017-08-19 (×2): qty 1

## 2017-08-19 NOTE — ED Triage Notes (Addendum)
Pt presents to Ed for assessment of right ear pain x 1 week ago, followed by chin numbness and swelling to the left side of his face ("like I've had novicaine") and pressure to the top of his head.  Pt states Thursday he started feeling fatigued at work, was sweating a lot, and decided to leave work.  States he has felt groggy and swimmy headed since then.  Pt states he was seen by his doctor, and started on gabapentin and valtrex.

## 2017-08-19 NOTE — H&P (Signed)
History and Physical   Ruben Allen:[id number] DOB: [birth year] DOA: 08/19/2017  PCP: Redmond School, MD  Chief Complaint: numbness of the face   HPI: this is a 57 year old man with medical problems including ongoing tobacco use ( 1 pack of cigarettes daily since age of 54), hypertension, elevated cardiac risk on statin and aspirin, anxiety on SSRI who presents with left facial numbness. History is obtained via patient report, spouse at bedside, and review of EMR.  At baseline, the patient is independent in his ADLs and IADLs, walks without assistive devices. Works an active job in Scientist, research (life sciences) and receiving locally. He reports the onset of right ear pain 2-3 weeks ago that radiated to his right parietal skull region, to the symptoms he sought medical attention use treated empirically for herpes zoster with Valtrex and given gabapentin for the symptoms. The right sided facial/skull symptoms seemed to improve however he began to develop left-sided facial and chin numbness. He reports being evaluated as local emergency department where CT scan of the head was performed which did not reveal any signs of CVA, no structural abnormality, therefore discharged home. However, his symptoms persisted and was accompanied by generalized fatigue and decreased exercise tolerance, reports he stayed home from work the Friday before admission. He's never had anything like this before, he denies any difficulty speaking, difficulty walking, difficulty swallowing, denies fevers, chills, dysuria, chest pain, shortness of breath, syncope or palpitations.  Denies ever having history of stroke, cancer, heart failure, diabetes. He did have a cardiac ablation procedure for tachycardia arrhythmia, AVRT back in 2012.  ED Course: in the emergency department heart rate normal, breast rate normal, systolic blood pressure 956O 150s mm Hg, O2 sats normal on room air.  CBC with white count of 13.3, neutrophil predominance. BMP  unremarkable, and 1.06, glucose 108. MRI the brain revealed a focus of restricted diffusion and faint enhancement in the left middle cerebellar peduncle; primary considerations include demyelinating disease and subacute infarct.  Neurology service was consulted, who recommended high-dose IV steroids, further diagnostics including cervical MRI spine, and workup for stroke.  Review of Systems: A complete ROS was obtained; pertinent positives negatives are denoted in the HPI. Otherwise, all systems are negative.   Past Medical History:  Diagnosis Date  . High cholesterol   . Hypertension    x 5 yrs   Social History   Socioeconomic History  . Marital status: Married    Spouse name: Not on file  . Number of children: Not on file  . Years of education: Not on file  . Highest education level: Not on file  Occupational History  . Not on file  Social Needs  . Financial resource strain: Not on file  . Food insecurity:    Worry: Not on file    Inability: Not on file  . Transportation needs:    Medical: Not on file    Non-medical: Not on file  Tobacco Use  . Smoking status: Current Every Day Smoker    Packs/day: 1.00    Years: 25.00    Pack years: 25.00    Types: Cigarettes  . Smokeless tobacco: Former Systems developer    Quit date: 01/16/2012  . Tobacco comment: quit a couple of years after smoking 36yrs.  Substance and Sexual Activity  . Alcohol use: Yes    Comment: weekends  . Drug use: No  . Sexual activity: Not on file  Lifestyle  . Physical activity:    Days per week: Not on file  Minutes per session: Not on file  . Stress: Not on file  Relationships  . Social connections:    Talks on phone: Not on file    Gets together: Not on file    Attends religious service: Not on file    Active member of club or organization: Not on file    Attends meetings of clubs or organizations: Not on file    Relationship status: Not on file  . Intimate partner violence:    Fear of current or ex  partner: Not on file    Emotionally abused: Not on file    Physically abused: Not on file    Forced sexual activity: Not on file  Other Topics Concern  . Not on file  Social History Narrative  . Not on file   Family hx: colon cancer in father  Physical Exam: Vitals:   08/19/17 1730 08/19/17 1745 08/19/17 1800 08/19/17 1930  BP: 128/81 (!) 145/92 (!) 145/81 (!) 151/86  Pulse: 64 71 69 69  Resp: 14 13 13 17   Temp:      TempSrc:      SpO2: 98% 97% 95% 97%  Weight:      Height:       General: Appears calm and comfortable, white man ENT: Grossly normal hearing, MMM.  Bilateral tympanic membranes intact, normal appearing, no bulging. Cardiovascular: RRR. No M/R/G. No LE edema.  Respiratory: CTA bilaterally. No wheezes or crackles. Normal respiratory effort. Breathing room air. Abdomen: Soft, non-tender. No guarding or rebound. Skin: No rash or induration seen on limited exam.  Upper extremities tattoos present. Musculoskeletal: Grossly normal tone BUE/BLE. Appropriate ROM.  Psychiatric: Grossly normal mood and affect. Neurologic: Moves all extremities in coordinated fashion.  Bilateral finger to nose and heel to shin WNL.  CN exam remarkable for impaired sensation to left maxillary region and chin.  Gross motor strength intact in proximal extremities x 4.    I have personally reviewed the following labs, culture data, and imaging studies.  Assessment/Plan:  #Left facial numbness concerning for CVA vs multiple sclerosis vs other On admission, constellation of symptoms including right sided facial symptoms (right ear pain, right hemi-cranial pain) that improved with development of chin and left side of face numbness.  Admission diagnostics with MRI of brain concerning for demyelinating disease versus subacute CVA. A/P: I discussed with the patient the concern for aforementioned diagnoses, will continue with empiric therapeutic trial of IV steroids (1 g solumedrol x total 5 days) in  event demyelinating disease is underlying (possibly more likely given migratory nature of symptoms); as well as continuing risk reduction for CVA in the event that is deemed etiology of symptoms. Will procure MRI cervical spine as recommended by neurology (radiology team relayed that this imaging test will be performed tomorrow due to recent IV contrast per institutional protocol).  Initiate PPI therapy for GI protection. TTE, telemetry, and imaging of neck vessels to evaluate for CVA. Risk stratification labs with A1c, lipid profile. B12 given neuro symptoms.   #Other problems: -Hypertension: continue home HCTZ, amlodipine -Hyperlipidemia / elevated CV risk: continue ASA 81 mg + statin -Anxiety: well controlled on home SSRI, continue -Smoking: at least 50 pack year history (reports has been up to close to 2 packs a day) - does qualify for lung cancer screening, recommend discussion in outpatient setting to pursue  DVT prophylaxis: Subq Lovenox Code Status: Full code Disposition Plan: Anticipate D/C potentially as early as tomorrow Consults called: neurology consulted by emergency medicine  team Admission status: admit to telemetry floor, hospital medicine service   Cheri Rous, MD [facility] Page:[phone]  If 7PM-7AM, please contact night-coverage [email or web address] Password [email or web address]

## 2017-08-19 NOTE — ED Notes (Signed)
Patient transported to MRI 

## 2017-08-19 NOTE — Consult Note (Signed)
NEURO HOSPITALIST      Requesting Physician: Dr. Johnney Killian    Chief Complaint: Numbness  History obtained from:  Patient     HPI:                                                                                                                                         Ruben Allen is an 57 y.o. male  With PMH significant for HLD, HTN who presents to [facility] ED with left face numbness for the past week.   Patient states that about 1 week ago he had pain in his right ear. At that time he went to the urgent care and they placed him prophylacally on Valtrex for shingles. Denies any lesions or outbreaks. Then 2 days later patient woke up with left facial numbness in his chin that progressed up to his lips, cheeks and to the top of his head. The numbness feels like he was "given Novocaine". The left facial numbness does not cross midline. Denies CP, SOB, any focal weakness.  He states that he has not been feeling himself, general fatigue for about 1 week.  Denies problems chewing or swallowing.  ED course:  BP:139/77, BG: 108, Cl: 96, WBC: 13.3 CT head: no acute intracranial abnormality MRI: Focus of restricted diffusion and faint enhancement in the left middle cerebellar peduncle. Primary considerations are demyelinating disease (multiple sclerosis) and subacute infarct  No previous stroke history noted.   Date last known well: 1 week ago Time last known well: unable to determine tPA Given: No: contraindicated, outside of window Modified Rankin: Rankin Score=0  NIHSS:0 1a Level of Conscious:0 1b LOC Questions: 0 1c LOC Commands: 0 2 Best Gaze: 0 3 Visual: 0 4 Facial Palsy: 0 5a Motor Arm - left:0  5b Motor Arm - Right:0  6a Motor Leg - Left:0  6b Motor Leg - Right: 0 7 Limb Ataxia: 0 8 Sensory: 0 9 Best Language: 0 10 Dysarthria:0 11 Extinct. and Inattention:0 TOTAL: 0   Past Medical History:  Diagnosis Date  . High cholesterol    . Hypertension    x 5 yrs    Past Surgical History:  Procedure Laterality Date  . cardiac cath    . COLONOSCOPY N/A 02/19/2013   Procedure: COLONOSCOPY;  Surgeon: Rogene Houston, MD;  [facility];  Service: Endoscopy;  Laterality: N/A;  145-moved to 320 Ann to notify pt  . GANGLION CYST EXCISION Left   . Thumb surgery      History reviewed. No pertinent family history.       Social History:  reports that he has been  smoking cigarettes.  He has a 25.00 pack-year smoking history. He quit smokeless tobacco use about 5 years ago. He reports that he drinks alcohol. He reports that he does not use drugs.  Allergies: No Known Allergies  Medications:                                                                                                                           No current facility-administered medications for this encounter.    Current Outpatient Medications  Medication Sig Dispense Refill  . amLODipine (NORVASC) 2.5 MG tablet Take 2.5 mg by mouth daily.    Marland Kitchen aspirin 81 MG tablet Take 81 mg by mouth daily.    . citalopram (CELEXA) 20 MG tablet Take 20 mg by mouth daily.    Marland Kitchen dicyclomine (BENTYL) 10 MG capsule TAKE ONE CAPSULE BY MOUTH TWICE DAILY BEFORE MEAL(S) 60 capsule 5  . hydrochlorothiazide (HYDRODIURIL) 25 MG tablet Take 25 mg by mouth daily.    Marland Kitchen HYDROcodone-acetaminophen (NORCO/VICODIN) 5-325 MG tablet Take 1 tablet by mouth every 4 (four) hours as needed. (Patient taking differently: Take 1 tablet by mouth every 4 (four) hours as needed for moderate pain. ) 15 tablet 0  . ibuprofen (ADVIL,MOTRIN) 200 MG tablet Take 200 mg by mouth daily.    Marland Kitchen loratadine (CLARITIN) 10 MG tablet Take 10 mg by mouth daily.    . pravastatin (PRAVACHOL) 40 MG tablet Take 40 mg by mouth daily.  3  . vitamin C (ASCORBIC ACID) 500 MG tablet Take 500 mg by mouth daily.    . valACYclovir (VALTREX) 500 MG tablet Take 500 mg by mouth 2 (two) times daily. Starting 08/10/2017 x 7 days.  0      ROS:                                                                                                                                       History obtained from the patient  General ROS: positive for - fatigue, negative for chills,  fever, night sweats, weight gain or weight loss  Ophthalmic ROS: negative for - blurry vision, double vision, eye pain or loss of vision Respiratory ROS: negative for - cough,  shortness of breath or wheezing Cardiovascular ROS: negative for - chest pain, dyspnea on exertion,  Musculoskeletal ROS: negative for - joint swelling or muscular weakness Neurological ROS: as noted in  HPI   General Examination:                                                                                                      Blood pressure 139/77, pulse 70, temperature 98.7 F (37.1 C), resp. rate 14, height 5\' 8"  (1.727 m), weight 79.4 kg (175 lb), SpO2 98 %.  HEENT-  Normocephalic, no lesions, without obvious abnormality.  Normal external eye and conjunctiva. No lesion seen in bilateral EACs. Some cerumen noted in Bilateral ear canals. Tympanic membrane intact bilaterally. Cardiovascular- S1-S2 audible, pulses palpable throughout   Lungs-no rhonchi or wheezing noted, no excessive working breathing.  Saturations within normal limits on RA Extremities- Warm, dry and intact Musculoskeletal-no joint tenderness, deformity or swelling Skin-warm and dry, no hyperpigmentation, vitiligo, or suspicious lesions  Neurological Examination Mental Status: Alert, oriented, thought content appropriate.  Speech fluent without evidence of aphasia.  Able to follow 3 step commands without difficulty. Cranial Nerves: II:  Visual fields grossly normal,  III,IV, VI: ptosis not present, extra-ocular motions intact bilaterally, pupils equal, round, reactive to light and accommodation V,VII: smile symmetric, facial light touch / pinprick sensation decreased on left. VIII: hearing normal  bilaterally IX,X: uvula rises symmetrically XI: bilateral shoulder shrug XII: midline tongue extension Motor: Right : Upper extremity   5/5    Left:     Upper extremity   5/5  Lower extremity   5/5     Lower extremity   5/5 Tone and bulk:normal tone throughout; no atrophy noted Sensory: Pinprick and light touch intact throughout, bilaterally Deep Tendon Reflexes: 2+ and symmetric throughout Plantars: Right: downgoing   Left: downgoing Cerebellar: Subtle dysmetria with left RAM and left H-S. Otherwise normal.  Gait: deferred   Lab Results: Basic Metabolic Panel: Recent Labs  Lab 08/19/17 1200  NA 138  K 3.7  CL 96*  CO2 29  GLUCOSE 108*  BUN 19  CREATININE 1.06  CALCIUM 9.4    CBC: Recent Labs  Lab 08/19/17 1200  WBC 13.3*  NEUTROABS 9.6*  HGB 16.5  HCT 49.8  MCV 92.4  PLT 241    Lipid Panel: No results for input(s): CHOL, TRIG, HDL, CHOLHDL, VLDL, LDLCALC in the last 168 hours.  CBG: No results for input(s): GLUCAP in the last 168 hours.  Imaging: Mr Jeri Cos And Wo Contrast  Result Date: 08/19/2017 CLINICAL DATA:  Left facial numbness for 5 days. EXAM: MRI HEAD WITHOUT AND WITH CONTRAST TECHNIQUE: Multiplanar, multiecho pulse sequences of the brain and surrounding structures were obtained without and with intravenous contrast. CONTRAST:  35mL MULTIHANCE GADOBENATE DIMEGLUMINE 529 MG/ML IV SOLN COMPARISON:  Head CT 08/12/2017 and MRI 05/03/2005 FINDINGS: Brain: There is a 1 cm focus of restricted diffusion, T2 hyperintensity, and faint enhancement in the left middle cerebellar peduncle. There is there is minimal T2 hyperintensity in periventricular white matter adjacent to the right occipital horn which is unchanged from 2007. The ventricles and sulci are normal. A developmental venous anomaly is noted in the anterior left frontal lobe. No intracranial hemorrhage, mass, midline shift, or extra-axial  fluid collection is identified. Vascular: Major intracranial vascular  flow voids are preserved. Skull and upper cervical spine: Unremarkable bone marrow signal. Sinuses/Orbits: Unremarkable orbits. Scattered paranasal sinus mucous retention cysts. Clear mastoid air cells. Other: None. IMPRESSION: Focus of restricted diffusion and faint enhancement in the left middle cerebellar peduncle. Primary considerations are demyelinating disease (multiple sclerosis) and subacute infarct. There is only a paucity of supratentorial white matter disease which involves right occipital periventricular white matter and is unchanged from 2007. Electronically Signed   By: Logan Bores M.D.   On: 08/19/2017 15:29    Laurey Morale, MSN, NP-C [facility] [phone] 08/19/2017, 5:45 PM   Assessment: 57 y.o. male with PMH significant for HLD, HTN who presents to [facility] ED with left face numbness for the past week. CT head: showed no acute intracranial abnormality.  1. MRI reveals a focus of restricted diffusion and faint enhancement in the left middle cerebellar peduncle. Primary considerations are demyelinating disease (multiple sclerosis) and subacute infarct. Symptoms and presentation more consistent with MS. Additionally, 2 subtle chronic periventricular lesions appear suspicious for MS, as well as a tiny linear additional T2-hyperintense lesion infratentorially.  2. Of note, he does not recall a prior episode of symptoms reminiscent of optic neuritis, nor does he recall a prior episode of facial motor/sensory symptoms, limb weakness, limb numbness or ataxia. Therefore, if the lesion is due to demyelinating disease, it is classifiable as a clinically isolated syndrome (CIS) 3. Stroke Risk Factors - hyperlipidemia and hypertension    Recommend --BP goal : normotensive --IV Solumedrol 1000 mg once per day x 5 days --Outpatient neurology follow-up. May be able to get his last 4 solumedrol infusions as an outpatient with either [facility] or [facility]. Would call clinics in the AM  to determine availability.  --LP at outpatient neurology follow up  --MRI cervical spine with and without contrast.  --Telemetry monitoring --Frequent neuro checks   Electronically signed: Dr. Kerney Elbe

## 2017-08-19 NOTE — ED Notes (Signed)
Per MRI- unable to do MRI scan due to previous scan with contrast. Will need to wait x48 hrs since last admin of contrast.

## 2017-08-19 NOTE — ED Provider Notes (Addendum)
[facility] EMERGENCY DEPARTMENT Provider Note   CSN: [id number] Arrival date & time: 08/19/17  1116     History   Chief Complaint Chief Complaint  Patient presents with  . Numbness    HPI Ruben Allen is a 57 y.o. male.  HPI   Ruben Allen is a 57 y.o. male, with a history of hypercholesterolemia and HTN, presenting to the ED with decreased sensation to the left side of the face for the last week.  States, "I feel things less on the left side as if I''ve had a Novocain injection."  He feels as though this has been worsening over the past week.  It does not cross the midline.  It involves the left side of the nose, tongue, lips, scalp, and ear. Additionally, patient states he "just feels off and cloudy headed."  Fatigue and occasional loss of balance.  States he had to take off work 2 days ago, which is very much unlike him.  In the week previous to the onset of his current symptoms, patient had progressive pain to the right side of the face, starting in the right ear radiating across the parietal scalp and into the right side of the face without crossing the midline.  Denies focal weakness, fever, N/V/D, difficulty swallowing, voice changes, changes in hearing or vision, shortness of breath, extremity numbness, facial droop, headache, or any other complaints.    Past Medical History:  Diagnosis Date  . High cholesterol   . Hypertension    x 5 yrs    Patient Active Problem List   Diagnosis Date Noted  . Change in stool 02/13/2013  . Rectal bleeding 02/13/2013  . Family hx of colon cancer 02/13/2013    Past Surgical History:  Procedure Laterality Date  . cardiac cath    . COLONOSCOPY N/A 02/19/2013   Procedure: COLONOSCOPY;  Surgeon: Rogene Houston, MD;  Location: [facility];  Service: Endoscopy;  Laterality: N/A;  145-moved to 320 Ann to notify pt  . GANGLION CYST EXCISION Left   . Thumb surgery          Home Medications    Prior to  Admission medications   Medication Sig Start Date End Date Taking? Authorizing Provider  amLODipine (NORVASC) 2.5 MG tablet Take 2.5 mg by mouth daily.   Yes [provider]  aspirin 81 MG tablet Take 81 mg by mouth daily.   Yes [provider]  citalopram (CELEXA) 20 MG tablet Take 20 mg by mouth daily.   Yes [provider]  dicyclomine (BENTYL) 10 MG capsule TAKE ONE CAPSULE BY MOUTH TWICE DAILY BEFORE MEAL(S)   Yes Rehman, Najeeb U, MD  hydrochlorothiazide (HYDRODIURIL) 25 MG tablet Take 25 mg by mouth daily.   Yes [provider]  HYDROcodone-acetaminophen (NORCO/VICODIN) 5-325 MG tablet Take 1 tablet by mouth every 4 (four) hours as needed. Patient taking differently: Take 1 tablet by mouth every 4 (four) hours as needed for moderate pain.  08/12/17  Yes Idol, Almyra Free, PA-C  ibuprofen (ADVIL,MOTRIN) 200 MG tablet Take 200 mg by mouth daily.   Yes [provider]  loratadine (CLARITIN) 10 MG tablet Take 10 mg by mouth daily.   Yes [provider]  pravastatin (PRAVACHOL) 40 MG tablet Take 40 mg by mouth daily. 07/24/17  Yes [provider]  vitamin C (ASCORBIC ACID) 500 MG tablet Take 500 mg by mouth daily.   Yes [provider]  valACYclovir (VALTREX) 500  MG tablet Take 500 mg by mouth 2 (two) times daily. Starting 08/10/2017 x 7 days. 08/10/17   [provider]    Family History History reviewed. No pertinent family history.  Social History Social History   Tobacco Use  . Smoking status: Current Every Day Smoker    Packs/day: 1.00    Years: 25.00    Pack years: 25.00    Types: Cigarettes  . Smokeless tobacco: Former Systems developer    Quit date: 01/16/2012  . Tobacco comment: quit a couple of years after smoking 87yrs.  Substance Use Topics  . Alcohol use: Yes    Comment: weekends  . Drug use: No     Allergies   Patient has no known allergies.   Review of Systems Review of Systems  Constitutional:  Positive for fatigue. Negative for chills and fever.  HENT: Negative for facial swelling, trouble swallowing and voice change.   Respiratory: Negative for shortness of breath.   Cardiovascular: Negative for chest pain.  Gastrointestinal: Negative for abdominal pain, diarrhea, nausea and vomiting.  Musculoskeletal: Negative for neck pain.  Neurological: Positive for numbness. Negative for dizziness, seizures, syncope, facial asymmetry, speech difficulty, weakness, light-headedness and headaches.  All other systems reviewed and are negative.    Physical Exam Updated Vital Signs BP (!) 149/89 (BP Location: Right Arm)   Pulse 89   Temp 98.7 F (37.1 C) (Oral)   Resp 17   SpO2 97%   Physical Exam  Constitutional: He is oriented to person, place, and time. He appears well-developed and well-nourished. No distress.  HENT:  Head: Normocephalic and atraumatic.  Right Ear: Tympanic membrane, external ear and ear canal normal.  Left Ear: Tympanic membrane, external ear and ear canal normal.  Mouth/Throat: Oropharynx is clear and moist.  No noted lesions, swelling, or color change to the face or scalp. No lesions in the ear canals.  Dentition appears to be intact and stable.  No noted area of swelling or fluctuance.  No trismus.  Mouth opening to at least 3 finger widths.  Handles oral secretions without difficulty.  No noted facial swelling.  No swelling or tenderness to the submental or submandibular regions.  No swelling or tenderness into the soft tissues of the neck.  Eyes: Pupils are equal, round, and reactive to light. Conjunctivae and EOM are normal.  Neck: Neck supple.  Cardiovascular: Normal rate, regular rhythm, normal heart sounds and intact distal pulses.  Pulmonary/Chest: Effort normal and breath sounds normal. No respiratory distress.  Abdominal: Soft. There is no tenderness. There is no guarding.  Musculoskeletal: He exhibits no edema.  Lymphadenopathy:    He has no cervical  adenopathy.  Neurological: He is alert and oriented to person, place, and time.  Patient endorses decreased sensation on the left side of the face, including the temporal, parietal, and occipital scalp, the left ear, tongue and lips. However, he can feel pain and can differentiate touch. Sensation to light touch grossly intact in the extremities. No noted speech deficits. No aphasia. Patient handles oral secretions without difficulty. No noted swallowing defects.  Equal grip strength bilaterally. Strength 5/5 in the upper extremities. Strength 5/5 with flexion and extension of the hips, knees, and ankles bilaterally.  Patellar DTRs 2+ bilaterally. Negative Romberg. No gait disturbance.  Coordination intact including heel to shin and finger to nose.  Cranial nerves III-XII grossly intact.  No facial droop.   Skin: Skin is warm and dry. He is not diaphoretic.  Psychiatric: He has a  normal mood and affect. His behavior is normal.  Nursing note and vitals reviewed.    ED Treatments / Results  Labs (all labs ordered are listed, but only abnormal results are displayed) Labs Reviewed  BASIC METABOLIC PANEL - Abnormal; Notable for the following components:      Result Value   Chloride 96 (*)    Glucose, Bld 108 (*)    All other components within normal limits  CBC WITH DIFFERENTIAL/PLATELET - Abnormal; Notable for the following components:   WBC 13.3 (*)    Neutro Abs 9.6 (*)    All other components within normal limits    EKG EKG Interpretation  Date/Time:  Sunday August 19 2017 11:38:19 EDT Ventricular Rate:  86 PR Interval:  130 QRS Duration: 92 QT Interval:  380 QTC Calculation: 454 R Axis:   66 Text Interpretation:  Normal sinus rhythm Normal ECG agree. no change Confirmed by Charlesetta Shanks [phone]) on 08/19/2017 12:43:39 PM   Radiology Mr Jeri Cos And Wo Contrast  Result Date: 08/19/2017 CLINICAL DATA:  Left facial numbness for 5 days. EXAM: MRI HEAD WITHOUT AND WITH  CONTRAST TECHNIQUE: Multiplanar, multiecho pulse sequences of the brain and surrounding structures were obtained without and with intravenous contrast. CONTRAST:  60mL MULTIHANCE GADOBENATE DIMEGLUMINE 529 MG/ML IV SOLN COMPARISON:  Head CT 08/12/2017 and MRI 05/03/2005 FINDINGS: Brain: There is a 1 cm focus of restricted diffusion, T2 hyperintensity, and faint enhancement in the left middle cerebellar peduncle. There is there is minimal T2 hyperintensity in periventricular white matter adjacent to the right occipital horn which is unchanged from 2007. The ventricles and sulci are normal. A developmental venous anomaly is noted in the anterior left frontal lobe. No intracranial hemorrhage, mass, midline shift, or extra-axial fluid collection is identified. Vascular: Major intracranial vascular flow voids are preserved. Skull and upper cervical spine: Unremarkable bone marrow signal. Sinuses/Orbits: Unremarkable orbits. Scattered paranasal sinus mucous retention cysts. Clear mastoid air cells. Other: None. IMPRESSION: Focus of restricted diffusion and faint enhancement in the left middle cerebellar peduncle. Primary considerations are demyelinating disease (multiple sclerosis) and subacute infarct. There is only a paucity of supratentorial white matter disease which involves right occipital periventricular white matter and is unchanged from 2007. Electronically Signed   By: Logan Bores M.D.   On: 08/19/2017 15:29    Procedures Procedures (including critical care time)  Medications Ordered in ED Medications  methylPREDNISolone sodium succinate (SOLU-MEDROL) 1,000 mg in sodium chloride 0.9 % 50 mL IVPB (has no administration in time range)  gadobenate dimeglumine (MULTIHANCE) injection 15 mL (15 mLs Intravenous Contrast Given 08/19/17 1459)     Initial Impression / Assessment and Plan / ED Course  I have reviewed the triage vital signs and the nursing notes.  Pertinent labs & imaging results that were  available during my care of the patient were reviewed by me and considered in my medical decision making (see chart for details).  Clinical Course as of Aug 19 2245  Sun Aug 19, 2017  1625 Spoke with Dr. Cheral Marker, neurologist.  States he will more closely reviewed the images and get back to me.   [SJ]  58 Spoke with neurology NP.  States they recommend admission for stroke work-up as well as treatment for possible MS with high-dose steroids.   [SJ]  1927 Spoke with Dr. Stana Bunting, hospitalist team.  Agrees to admit the patient.   [SJ]    Clinical Course User Index [SJ] Lorayne Bender, PA-C    Patient  presents with left-sided facial numbness.  No other deficits noted on exam.  Small cerebellar lesion noted on MRI, subacute infarct versus MS.  Patient admitted via the hospitalist with neurology consulting.  Findings and plan of care discussed with Charlesetta Shanks, MD.   Vitals:   08/19/17 1730 08/19/17 1745 08/19/17 1800 08/19/17 1930  BP: 128/81 (!) 145/92 (!) 145/81 (!) 151/86  Pulse: 64 71 69 69  Resp: 14 13 13 17   Temp:      TempSrc:      SpO2: 98% 97% 95% 97%  Weight:      Height:         Final Clinical Impressions(s) / ED Diagnoses   Final diagnoses:  Numbness    ED Discharge Orders    None       Layla Maw 08/19/17 2001    Joy, Shawn C, PA-C 08/19/17 2247    Charlesetta Shanks, MD 08/20/17 2141

## 2017-08-19 NOTE — ED Notes (Signed)
Pt requesting nicotine patch due to being a daily smoker. Ryland, RN notified of request.

## 2017-08-19 NOTE — Progress Notes (Signed)
Spoke with Radiologist.  Patient received contrast from MRI brain at 3 pm.  Patient can receive contrast again after 9am on 8/5.

## 2017-08-20 ENCOUNTER — Inpatient Hospital Stay (HOSPITAL_COMMUNITY): Payer: Managed Care, Other (non HMO)

## 2017-08-20 ENCOUNTER — Observation Stay (HOSPITAL_COMMUNITY): Payer: Managed Care, Other (non HMO)

## 2017-08-20 ENCOUNTER — Observation Stay (HOSPITAL_BASED_OUTPATIENT_CLINIC_OR_DEPARTMENT_OTHER): Payer: Managed Care, Other (non HMO)

## 2017-08-20 DIAGNOSIS — I6381 Other cerebral infarction due to occlusion or stenosis of small artery: Secondary | ICD-10-CM | POA: Diagnosis present

## 2017-08-20 DIAGNOSIS — F419 Anxiety disorder, unspecified: Secondary | ICD-10-CM | POA: Diagnosis present

## 2017-08-20 DIAGNOSIS — I6302 Cerebral infarction due to thrombosis of basilar artery: Secondary | ICD-10-CM | POA: Diagnosis not present

## 2017-08-20 DIAGNOSIS — Z7982 Long term (current) use of aspirin: Secondary | ICD-10-CM | POA: Diagnosis not present

## 2017-08-20 DIAGNOSIS — E785 Hyperlipidemia, unspecified: Secondary | ICD-10-CM | POA: Diagnosis present

## 2017-08-20 DIAGNOSIS — G379 Demyelinating disease of central nervous system, unspecified: Secondary | ICD-10-CM | POA: Diagnosis not present

## 2017-08-20 DIAGNOSIS — B029 Zoster without complications: Secondary | ICD-10-CM | POA: Diagnosis present

## 2017-08-20 DIAGNOSIS — R29810 Facial weakness: Secondary | ICD-10-CM | POA: Diagnosis present

## 2017-08-20 DIAGNOSIS — G35 Multiple sclerosis: Secondary | ICD-10-CM | POA: Diagnosis present

## 2017-08-20 DIAGNOSIS — R29700 NIHSS score 0: Secondary | ICD-10-CM | POA: Diagnosis present

## 2017-08-20 DIAGNOSIS — Z79899 Other long term (current) drug therapy: Secondary | ICD-10-CM | POA: Diagnosis not present

## 2017-08-20 DIAGNOSIS — G459 Transient cerebral ischemic attack, unspecified: Secondary | ICD-10-CM

## 2017-08-20 DIAGNOSIS — Z800 Family history of malignant neoplasm of digestive organs: Secondary | ICD-10-CM | POA: Diagnosis not present

## 2017-08-20 DIAGNOSIS — R200 Anesthesia of skin: Secondary | ICD-10-CM | POA: Diagnosis present

## 2017-08-20 DIAGNOSIS — F17210 Nicotine dependence, cigarettes, uncomplicated: Secondary | ICD-10-CM | POA: Diagnosis present

## 2017-08-20 DIAGNOSIS — E7800 Pure hypercholesterolemia, unspecified: Secondary | ICD-10-CM | POA: Diagnosis present

## 2017-08-20 DIAGNOSIS — Z720 Tobacco use: Secondary | ICD-10-CM | POA: Diagnosis not present

## 2017-08-20 DIAGNOSIS — I10 Essential (primary) hypertension: Secondary | ICD-10-CM | POA: Diagnosis present

## 2017-08-20 LAB — BASIC METABOLIC PANEL
ANION GAP: 12 (ref 5–15)
BUN: 23 mg/dL — ABNORMAL HIGH (ref 6–20)
CO2: 27 mmol/L (ref 22–32)
Calcium: 9.5 mg/dL (ref 8.9–10.3)
Chloride: 98 mmol/L (ref 98–111)
Creatinine, Ser: 1.02 mg/dL (ref 0.61–1.24)
Glucose, Bld: 159 mg/dL — ABNORMAL HIGH (ref 70–99)
Potassium: 3.8 mmol/L (ref 3.5–5.1)
Sodium: 137 mmol/L (ref 135–145)

## 2017-08-20 LAB — LIPID PANEL
Cholesterol: 189 mg/dL (ref 0–200)
HDL: 60 mg/dL (ref >40)
LDL Cholesterol: 117 mg/dL — ABNORMAL HIGH (ref 0–99)
TRIGLYCERIDES: 58 mg/dL (ref <150)
Total CHOL/HDL Ratio: 3.2 RATIO
VLDL: 12 mg/dL (ref 0–40)

## 2017-08-20 LAB — HEMOGLOBIN A1C
HEMOGLOBIN A1C: 5.8 % — AB (ref 4.8–5.6)
MEAN PLASMA GLUCOSE: 119.76 mg/dL

## 2017-08-20 LAB — CBC
HCT: 49.3 % (ref 39.0–52.0)
HEMOGLOBIN: 16.5 g/dL (ref 13.0–17.0)
MCH: 30.3 pg (ref 26.0–34.0)
MCHC: 33.5 g/dL (ref 30.0–36.0)
MCV: 90.6 fL (ref 78.0–100.0)
Platelets: 264 10*3/uL (ref 150–400)
RBC: 5.44 MIL/uL (ref 4.22–5.81)
RDW: 12.3 % (ref 11.5–15.5)
WBC: 14.3 10*3/uL — ABNORMAL HIGH (ref 4.0–10.5)

## 2017-08-20 LAB — VITAMIN B12: VITAMIN B 12: 377 pg/mL (ref 180–914)

## 2017-08-20 MED ORDER — CLOPIDOGREL BISULFATE 300 MG PO TABS
300.0000 mg | ORAL_TABLET | Freq: Once | ORAL | Status: AC
  Administered 2017-08-20: 300 mg via ORAL
  Filled 2017-08-20: qty 1

## 2017-08-20 MED ORDER — DICYCLOMINE HCL 10 MG PO CAPS
10.0000 mg | ORAL_CAPSULE | Freq: Three times a day (TID) | ORAL | Status: DC
  Administered 2017-08-20 – 2017-08-21 (×3): 10 mg via ORAL
  Filled 2017-08-20 (×5): qty 1

## 2017-08-20 MED ORDER — TRAZODONE HCL 100 MG PO TABS
100.0000 mg | ORAL_TABLET | Freq: Every evening | ORAL | Status: DC | PRN
  Administered 2017-08-20: 100 mg via ORAL
  Filled 2017-08-20: qty 1

## 2017-08-20 MED ORDER — IOPAMIDOL (ISOVUE-370) INJECTION 76%
INTRAVENOUS | Status: AC
  Filled 2017-08-20: qty 50

## 2017-08-20 MED ORDER — CLOPIDOGREL BISULFATE 75 MG PO TABS
75.0000 mg | ORAL_TABLET | Freq: Every day | ORAL | Status: DC
  Administered 2017-08-21: 75 mg via ORAL
  Filled 2017-08-20: qty 1

## 2017-08-20 MED ORDER — IOPAMIDOL (ISOVUE-370) INJECTION 76%
50.0000 mL | Freq: Once | INTRAVENOUS | Status: AC | PRN
  Administered 2017-08-20: 50 mL via INTRAVENOUS

## 2017-08-20 NOTE — Progress Notes (Signed)
[facility]  New pt for [facility] and [facility] services at DC to support home Solumedrol therapy. In hospital teaching with wife and pt.  Pt will DC today after hospital dose for [facility] to provide first home dose tomorrow, 08-21-17  If patient discharges after hours, please call [phone].   Larry Sierras 08/20/2017, 1:02 PM

## 2017-08-20 NOTE — Progress Notes (Signed)
  Echocardiogram 2D Echocardiogram has been performed.  Ruben Allen 08/20/2017, 10:56 AM

## 2017-08-20 NOTE — Evaluation (Signed)
Physical Therapy Evaluation Patient Details Name: Ruben Allen MRN: [id number] DOB: May 10, 1960 Today's Date: 08/20/2017   History of Present Illness  Patient is a 57 y/o male presenting with facial numbness. Focus of restricted diffusion and faint enhancement in the left middle cerebellar peduncle. Primary considerations are demyelinating disease (multiple sclerosis) and subacute infarct. PMH significant for ongoing tobacco use ( 1 pack of cigarettes daily since age of 12), hypertension, elevated cardiac risk on statin and aspirin, anxiety on SSRI.     Clinical Impression  Ruben Allen is a very pleasant 57 y/o male admitted with the above listed diagnosis. Patient reports that prior to admission he was independent with all ADLs, IADLS and worked full time. Patient today requiring only supervision for general safety and standing balance with no physical assist needed for stability. Patient feels as if he is at baseline level of functioning. No further acute PT needs identified. PT to sign off. Thanks for the referral!    Follow Up Recommendations No PT follow up    Equipment Recommendations  None recommended by PT    Recommendations for Other Services       Precautions / Restrictions Precautions Precautions: Fall Restrictions Weight Bearing Restrictions: No      Mobility  Bed Mobility Overal bed mobility: Modified Independent                Transfers Overall transfer level: Needs assistance Equipment used: None Transfers: Sit to/from Stand Sit to Stand: Supervision         General transfer comment: for general safety and immediate standing balance  Ambulation/Gait Ambulation/Gait assistance: Supervision Gait Distance (Feet): 400 Feet Assistive device: None Gait Pattern/deviations: Step-through pattern;Narrow base of support Gait velocity: WNL   General Gait Details: normal gait pattern without LOB; supervision for general safety  Stairs Stairs:  Yes Stairs assistance: Supervision Stair Management: Alternating pattern;No rails Number of Stairs: 3(x2)    Wheelchair Mobility    Modified Rankin (Stroke Patients Only)       Balance Overall balance assessment: Modified Independent                                           Pertinent Vitals/Pain Pain Assessment: No/denies pain    Home Living Family/patient expects to be discharged to:: Private residence Living Arrangements: Spouse/significant other   Type of Home: House Home Access: Level entry     Home Layout: One level Home Equipment: None      Prior Function Level of Independence: Independent         Comments: works full time in shipping/receiving     Journalist, newspaper        Extremity/Trunk Assessment   Upper Extremity Assessment Upper Extremity Assessment: Overall WFL for tasks assessed    Lower Extremity Assessment Lower Extremity Assessment: Overall WFL for tasks assessed    Cervical / Trunk Assessment Cervical / Trunk Assessment: Normal  Communication   Communication: No difficulties  Cognition Arousal/Alertness: Awake/alert Behavior During Therapy: WFL for tasks assessed/performed Overall Cognitive Status: Within Functional Limits for tasks assessed                                        General Comments      Exercises     Assessment/Plan    PT  Assessment Patent does not need any further PT services  PT Problem List         PT Treatment Interventions      PT Goals (Current goals can be found in the Care Plan section)  Acute Rehab PT Goals Patient Stated Goal: return to work PT Goal Formulation: With patient Time For Goal Achievement: 08/24/17 Potential to Achieve Goals: Good    Frequency     Barriers to discharge        Co-evaluation               AM-PAC PT "6 Clicks" Daily Activity  Outcome Measure Difficulty turning over in bed (including adjusting bedclothes, sheets and  blankets)?: None Difficulty moving from lying on back to sitting on the side of the bed? : None Difficulty sitting down on and standing up from a chair with arms (e.g., wheelchair, bedside commode, etc,.)?: A Little Help needed moving to and from a bed to chair (including a wheelchair)?: A Little Help needed walking in hospital room?: A Little Help needed climbing 3-5 steps with a railing? : A Little 6 Click Score: 20    End of Session Equipment Utilized During Treatment: Gait belt Activity Tolerance: Patient tolerated treatment well Patient left: in bed;with call bell/phone within reach;with family/visitor present Nurse Communication: Mobility status PT Visit Diagnosis: Unsteadiness on feet (R26.81);Other abnormalities of gait and mobility (R26.89)    Time: 1610-9604 PT Time Calculation (min) (ACUTE ONLY): 20 min   Charges:   PT Evaluation $PT Eval Moderate Complexity: 1 Mod          Ruben Allen, PT, DPT 08/20/17 9:15 AM Pager: [phone]

## 2017-08-20 NOTE — Progress Notes (Signed)
OT Cancellation Note  Patient Details Name: Ruben Allen MRN: [id number] DOB: 07-05-1960   Cancelled Treatment:    Reason Eval/Treat Not Completed: OT screened, no needs identified, will sign off Spoke with PT [name] and no acute OT needs   Vonita Moss   OTR/L Pager: [phone] Office: [phone] .  08/20/2017, 12:34 PM

## 2017-08-20 NOTE — Progress Notes (Signed)
Have arranged for IV solumedrol at home for the AM in case patient can be discharged after CTA head/neck and MRI neck. ?MS vs CVA -outpatient neurology follow up Eulogio Bear DO

## 2017-08-20 NOTE — Care Management Note (Signed)
Case Management Note  Patient Details  Name: Ruben Allen MRN: [id number] Date of Birth: 1960/02/27  Subjective/Objective:      Patient in with MS. He is from home with spouse.               Action/Plan: Pt is able to d/c home and receive his IV solumedrol at home. CM met with the patient and he would like to use [facility] for this service. Pam with [facility] IV therapy notified and accepted the referral. Pt to d/c with [facility] for the home IV Solumedrol. Bedside RN updated. CM following.  Expected Discharge Date:                  Expected Discharge Plan:  [place]  In-House Referral:     Discharge planning Services  CM Consult  Post Acute Care Choice:  Home Health Choice offered to:  Patient, Spouse  DME Arranged:    DME Agency:     HH Arranged:  RN [place] Agency:  [place]  Status of Service:  In process, will continue to follow  If discussed at Long Length of Stay Meetings, dates discussed:    Additional Comments:  Pollie Friar, RN 08/20/2017, 4:14 PM

## 2017-08-20 NOTE — Progress Notes (Signed)
Pt refused to have SCD on

## 2017-08-20 NOTE — Progress Notes (Addendum)
Subjective: Patient still feels as though he is numb on the left chin, left cheek and left forehead.  He has no other symptoms that he can describe.  Exam: Vitals:   08/19/17 2310 08/20/17 0345  BP: (!) 161/82 (!) 147/88  Pulse: 67 76  Resp: 18 17  Temp: 97.8 F (36.6 C) 97.7 F (36.5 C)  SpO2: 97% 96%    Physical Exam   HEENT-  Normocephalic, no lesions, without obvious abnormality.  Normal external eye and conjunctiva.    Extremities- Warm, dry and intact Musculoskeletal-no joint tenderness, deformity or swelling Skin-warm and dry, no hyperpigmentation, vitiligo, or suspicious lesions    Neuro:  Mental Status: Alert, oriented, thought content appropriate.  Speech fluent without evidence of aphasia.  Able to follow 3 step commands without difficulty. Cranial Nerves: II:  Visual fields grossly normal,  III,IV, VI: ptosis not present, extra-ocular motions intact bilaterally pupils equal, round, reactive to light and accommodation V,VII: Slight left facial droop, as stated above he has numbness on the left chin, left cheek and left forehead VIII: hearing normal bilaterally IX,X: uvula rises midline XI: bilateral shoulder shrug XII: midline tongue extension Motor: Right : Upper extremity   5/5    Left:     Upper extremity   5/5  Lower extremity   5/5     Lower extremity   5/5 Tone and bulk:normal tone throughout; no atrophy noted Sensory: Pinprick and light touch intact throughout, bilaterally Deep Tendon Reflexes: 2+ and symmetric throughout Plantars: Right: downgoing   Left: downgoing Cerebellar: normal finger-to-nose,  and normal heel-to-shin test Gait: Not tested    Medications:  Prior to Admission:  Medications Prior to Admission  Medication Sig Dispense Refill Last Dose  . amLODipine (NORVASC) 2.5 MG tablet Take 2.5 mg by mouth daily.   08/19/2017 at Unknown time  . aspirin 81 MG tablet Take 81 mg by mouth daily.   08/18/2017 at Unknown time  . citalopram  (CELEXA) 20 MG tablet Take 20 mg by mouth daily.   08/18/2017 at Unknown time  . dicyclomine (BENTYL) 10 MG capsule TAKE ONE CAPSULE BY MOUTH TWICE DAILY BEFORE MEAL(S) 60 capsule 5 08/19/2017 at Unknown time  . hydrochlorothiazide (HYDRODIURIL) 25 MG tablet Take 25 mg by mouth daily.   08/19/2017 at Unknown time  . HYDROcodone-acetaminophen (NORCO/VICODIN) 5-325 MG tablet Take 1 tablet by mouth every 4 (four) hours as needed. (Patient taking differently: Take 1 tablet by mouth every 4 (four) hours as needed for moderate pain. ) 15 tablet 0 Past Week at Unknown time  . ibuprofen (ADVIL,MOTRIN) 200 MG tablet Take 200 mg by mouth daily.   08/19/2017 at Unknown time  . loratadine (CLARITIN) 10 MG tablet Take 10 mg by mouth daily.   08/19/2017 at Unknown time  . pravastatin (PRAVACHOL) 40 MG tablet Take 40 mg by mouth daily.  3 08/19/2017 at Unknown time  . vitamin C (ASCORBIC ACID) 500 MG tablet Take 500 mg by mouth daily.   08/19/2017 at Unknown time  . valACYclovir (VALTREX) 500 MG tablet Take 500 mg by mouth 2 (two) times daily. Starting 08/10/2017 x 7 days.  0 Not Taking at Unknown time   Scheduled: . amLODipine  2.5 mg Oral Daily  . aspirin EC  81 mg Oral Daily  . citalopram  20 mg Oral Daily  . enoxaparin (LOVENOX) injection  40 mg Subcutaneous Daily  . hydrochlorothiazide  25 mg Oral Daily  . nicotine  21 mg Transdermal Daily  .  pantoprazole  40 mg Oral Daily  . pravastatin  40 mg Oral Daily  . sodium chloride flush  3 mL Intravenous Q12H   Continuous: . methylPREDNISolone (SOLU-MEDROL) injection      Pertinent Labs/Diagnostics: Glucose 159 White blood cell count 14.3 Echocardiogram: Study Conclusions  - Left ventricle: The cavity size was normal. Systolic function was   normal. The estimated ejection fraction was in the range of 60%   to 65%. Wall motion was normal; there were no regional wall   motion abnormalities. - Aortic valve: Valve area (VTI): 2.64 cm^2. Valve area (Vmean):   2.81  cm^2. - Atrial septum: No defect or patent foramen ovale was identified  Mr Brain W And Wo Contrast  Result Date: 08/19/2017  IMPRESSION: Focus of restricted diffusion and faint enhancement in the left middle cerebellar peduncle. Primary considerations are demyelinating disease (multiple sclerosis) and subacute infarct. There is only a paucity of supratentorial white matter disease which involves right occipital periventricular white matter and is unchanged from 2007. Electronically Signed   By: Logan Bores M.D.   On: 08/19/2017 15:29   CTA head neck pending MRI of cervical spine pending LDL 117, A1c 5.8,    Etta Quill PA-C [facility] [phone]   Assessment: 57 yo M with sudden onset(on awakening) facial numbness and faintly enhancing lesion on MRI. With his risk factors and history, I think that small vessel ischemic stroke is more likely than demyelinating disease.   With him already recievnig 2 doses of solumedrol, would be reasonable to complete a 3 day course, but I would not start disease modifying therapy based on his current episode.   Recommendations: 1) solumedrol x 3 days 2) MRI cervical spine to look for additional lesions which could suggest MS 3) PLavix 300mg  load x 1, then 75mg  daily in addition to asa x 3 weeks, followed by ASA monotherapy.  4) CTA head and neck 5) would advance statin therapy given LDL greater than 75  Roland Rack, MD [facility] [phone]  If 7pm- 7am, please page neurology on call as listed in [place].  08/20/2017, 12:19 PM

## 2017-08-21 DIAGNOSIS — I639 Cerebral infarction, unspecified: Secondary | ICD-10-CM

## 2017-08-21 DIAGNOSIS — E785 Hyperlipidemia, unspecified: Secondary | ICD-10-CM

## 2017-08-21 DIAGNOSIS — Z720 Tobacco use: Secondary | ICD-10-CM

## 2017-08-21 DIAGNOSIS — G379 Demyelinating disease of central nervous system, unspecified: Secondary | ICD-10-CM

## 2017-08-21 DIAGNOSIS — I6302 Cerebral infarction due to thrombosis of basilar artery: Secondary | ICD-10-CM

## 2017-08-21 MED ORDER — CLOPIDOGREL BISULFATE 75 MG PO TABS: 75.0000 mg | ORAL_TABLET | Freq: Every day | ORAL | 0 refills | Status: DC

## 2017-08-21 MED ORDER — NICOTINE 21 MG/24HR TD PT24: 21.0000 mg | MEDICATED_PATCH | Freq: Every day | TRANSDERMAL | 0 refills | Status: DC

## 2017-08-21 MED ORDER — ATORVASTATIN CALCIUM 40 MG PO TABS: 40.0000 mg | ORAL_TABLET | Freq: Every day | ORAL | 0 refills | Status: DC

## 2017-08-21 MED ORDER — ATORVASTATIN CALCIUM 40 MG PO TABS: 40.0000 mg | ORAL_TABLET | Freq: Every day | ORAL | Status: DC

## 2017-08-21 NOTE — Progress Notes (Signed)
Subjective: Patient still feels as though he is numb on the left chin, left cheek but left forehead resolved. He mentioned that he had some ear pain on the right one week before the current event. I am not sure how significant it was and how that might related to his current event  Exam: Vitals:   08/21/17 0756 08/21/17 1229  BP: 136/75 (!) 142/95  Pulse: 79 84  Resp: 18 18  Temp: 97.9 F (36.6 C) 98.3 F (36.8 C)  SpO2: 98% 97%    Physical Exam   HEENT-  Normocephalic, no lesions, without obvious abnormality.  Normal external eye and conjunctiva.    Extremities- Warm, dry and intact Musculoskeletal-no joint tenderness, deformity or swelling Skin-warm and dry, no hyperpigmentation, vitiligo, or suspicious lesions    Neuro:  Mental Status: Alert, oriented, thought content appropriate.  Speech fluent without evidence of aphasia.  Able to follow 3 step commands without difficulty. Cranial Nerves: II:  Visual fields grossly normal,  III,IV, VI: ptosis not present, extra-ocular motions intact bilaterally pupils equal, round, reactive to light and accommodation V,VII: Slight left facial droop, as stated above he has numbness on the left chin, left cheek  VIII: hearing normal bilaterally IX,X: uvula rises midline XI: bilateral shoulder shrug XII: midline tongue extension Motor: Right : Upper extremity   5/5    Left:     Upper extremity   5/5  Lower extremity   5/5     Lower extremity   5/5 Tone and bulk:normal tone throughout; no atrophy noted Sensory: Pinprick and light touch intact throughout, bilaterally Deep Tendon Reflexes: 2+ and symmetric throughout Plantars: Right: downgoing   Left: downgoing Cerebellar: normal finger-to-nose,  and normal heel-to-shin test Gait: Not tested    Medications:  Prior to Admission:  Medications Prior to Admission  Medication Sig Dispense Refill Last Dose  . amLODipine (NORVASC) 2.5 MG tablet Take 2.5 mg by mouth daily.   08/19/2017 at  Unknown time  . aspirin 81 MG tablet Take 81 mg by mouth daily.   08/18/2017 at Unknown time  . citalopram (CELEXA) 20 MG tablet Take 20 mg by mouth daily.   08/18/2017 at Unknown time  . dicyclomine (BENTYL) 10 MG capsule TAKE ONE CAPSULE BY MOUTH TWICE DAILY BEFORE MEAL(S) 60 capsule 5 08/19/2017 at Unknown time  . hydrochlorothiazide (HYDRODIURIL) 25 MG tablet Take 25 mg by mouth daily.   08/19/2017 at Unknown time  . HYDROcodone-acetaminophen (NORCO/VICODIN) 5-325 MG tablet Take 1 tablet by mouth every 4 (four) hours as needed. (Patient taking differently: Take 1 tablet by mouth every 4 (four) hours as needed for moderate pain. ) 15 tablet 0 Past Week at Unknown time  . ibuprofen (ADVIL,MOTRIN) 200 MG tablet Take 200 mg by mouth daily.   08/19/2017 at Unknown time  . loratadine (CLARITIN) 10 MG tablet Take 10 mg by mouth daily.   08/19/2017 at Unknown time  . pravastatin (PRAVACHOL) 40 MG tablet Take 40 mg by mouth daily.  3 08/19/2017 at Unknown time  . vitamin C (ASCORBIC ACID) 500 MG tablet Take 500 mg by mouth daily.   08/19/2017 at Unknown time  . valACYclovir (VALTREX) 500 MG tablet Take 500 mg by mouth 2 (two) times daily. Starting 08/10/2017 x 7 days.  0 Not Taking at Unknown time   Scheduled: . amLODipine  2.5 mg Oral Daily  . aspirin EC  81 mg Oral Daily  . atorvastatin  40 mg Oral q1800  . citalopram  20 mg  Oral Daily  . clopidogrel  75 mg Oral Daily  . dicyclomine  10 mg Oral TID AC & HS  . enoxaparin (LOVENOX) injection  40 mg Subcutaneous Daily  . hydrochlorothiazide  25 mg Oral Daily  . nicotine  21 mg Transdermal Daily  . pantoprazole  40 mg Oral Daily  . sodium chloride flush  3 mL Intravenous Q12H   Continuous: . methylPREDNISolone (SOLU-MEDROL) injection 1,000 mg (08/21/17 1142)    Pertinent Labs/Diagnostics:  Echocardiogram: Study Conclusions  - Left ventricle: The cavity size was normal. Systolic function was   normal. The estimated ejection fraction was in the range of  60%   to 65%. Wall motion was normal; there were no regional wall   motion abnormalities. - Aortic valve: Valve area (VTI): 2.64 cm^2. Valve area (Vmean):   2.81 cm^2. - Atrial septum: No defect or patent foramen ovale was identified  Ct Angio Head W Or Wo Contrast  Result Date: 08/20/2017 CLINICAL DATA:  Stroke follow-up. Stabbing headaches. Left lip numbness. EXAM: CT ANGIOGRAPHY HEAD AND NECK TECHNIQUE: Multidetector CT imaging of the head and neck was performed using the standard protocol during bolus administration of intravenous contrast. Multiplanar CT image reconstructions and MIPs were obtained to evaluate the vascular anatomy. Carotid stenosis measurements (when applicable) are obtained utilizing NASCET criteria, using the distal internal carotid diameter as the denominator. CONTRAST:  48mL ISOVUE-370 IOPAMIDOL (ISOVUE-370) INJECTION 76% COMPARISON:  None. FINDINGS: CT HEAD FINDINGS Brain: There is no mass, hemorrhage or extra-axial collection. The size and configuration of the ventricles and extra-axial CSF spaces are normal. There is no acute or chronic infarction. The brain parenchyma is normal. Skull: The visualized skull base, calvarium and extracranial soft tissues are normal. Sinuses/Orbits: No fluid levels or advanced mucosal thickening of the visualized paranasal sinuses. No mastoid or middle ear effusion. The orbits are normal. CTA NECK FINDINGS AORTIC ARCH: There is no calcific atherosclerosis of the aortic arch. There is no aneurysm, dissection or hemodynamically significant stenosis of the visualized ascending aorta and aortic arch. Normal variant aortic arch branching pattern with the left vertebral artery arising independently from the aortic arch. The visualized proximal subclavian arteries are widely patent. RIGHT CAROTID SYSTEM: --Common carotid artery: Widely patent origin without common carotid artery dissection or aneurysm. --Internal carotid artery: No dissection, occlusion or  aneurysm. No hemodynamically significant stenosis. --External carotid artery: No acute abnormality. LEFT CAROTID SYSTEM: --Common carotid artery: Widely patent origin without common carotid artery dissection or aneurysm. --Internal carotid artery:No dissection, occlusion or aneurysm. No hemodynamically significant stenosis. --External carotid artery: No acute abnormality. VERTEBRAL ARTERIES: Right dominant configuration. Both origins are normal. No dissection, occlusion or flow-limiting stenosis to the vertebrobasilar confluence. SKELETON: There is no bony spinal canal stenosis. No lytic or blastic lesion. OTHER NECK: Normal pharynx, larynx and major salivary glands. No cervical lymphadenopathy. Unremarkable thyroid gland. UPPER CHEST: No pneumothorax or pleural effusion. No nodules or masses. CTA HEAD FINDINGS ANTERIOR CIRCULATION: --Intracranial internal carotid arteries: Normal. --Anterior cerebral arteries: Normal. Both A1 segments are present. Patent anterior communicating artery. --Middle cerebral arteries: Normal. --Posterior communicating arteries: Absent bilaterally. POSTERIOR CIRCULATION: --Basilar artery: Normal. --Posterior cerebral arteries: Normal. --Superior cerebellar arteries: Normal. --Inferior cerebellar arteries: Normal anterior and posterior inferior cerebellar arteries. VENOUS SINUSES: As permitted by contrast timing, patent. ANATOMIC VARIANTS: None DELAYED PHASE: No parenchymal contrast enhancement. Review of the MIP images confirms the above findings. IMPRESSION: No emergent large vessel occlusion or hemodynamically significant stenosis. Electronically Signed   By: Ulyses Jarred  M.D.   On: 08/20/2017 19:06   Ct Head Wo Contrast  Result Date: 08/12/2017 CLINICAL DATA:  57 year old male with acute headache and lip numbness for 2 days. EXAM: CT HEAD WITHOUT CONTRAST TECHNIQUE: Contiguous axial images were obtained from the base of the skull through the vertex without intravenous contrast.  COMPARISON:  05/03/2005 brain MR FINDINGS: Brain: No evidence of acute infarction, hemorrhage, hydrocephalus, extra-axial collection or mass lesion/mass effect. Vascular: No hyperdense vessel or unexpected calcification. Skull: Normal. Negative for fracture or focal lesion. Sinuses/Orbits: No acute finding. Other: None. IMPRESSION: Unremarkable noncontrast head CT. Electronically Signed   By: Margarette Canada M.D.   On: 08/12/2017 11:07   Ct Angio Neck W Or Wo Contrast  Result Date: 08/20/2017 CLINICAL DATA:  Stroke follow-up. Stabbing headaches. Left lip numbness. EXAM: CT ANGIOGRAPHY HEAD AND NECK TECHNIQUE: Multidetector CT imaging of the head and neck was performed using the standard protocol during bolus administration of intravenous contrast. Multiplanar CT image reconstructions and MIPs were obtained to evaluate the vascular anatomy. Carotid stenosis measurements (when applicable) are obtained utilizing NASCET criteria, using the distal internal carotid diameter as the denominator. CONTRAST:  85mL ISOVUE-370 IOPAMIDOL (ISOVUE-370) INJECTION 76% COMPARISON:  None. FINDINGS: CT HEAD FINDINGS Brain: There is no mass, hemorrhage or extra-axial collection. The size and configuration of the ventricles and extra-axial CSF spaces are normal. There is no acute or chronic infarction. The brain parenchyma is normal. Skull: The visualized skull base, calvarium and extracranial soft tissues are normal. Sinuses/Orbits: No fluid levels or advanced mucosal thickening of the visualized paranasal sinuses. No mastoid or middle ear effusion. The orbits are normal. CTA NECK FINDINGS AORTIC ARCH: There is no calcific atherosclerosis of the aortic arch. There is no aneurysm, dissection or hemodynamically significant stenosis of the visualized ascending aorta and aortic arch. Normal variant aortic arch branching pattern with the left vertebral artery arising independently from the aortic arch. The visualized proximal subclavian  arteries are widely patent. RIGHT CAROTID SYSTEM: --Common carotid artery: Widely patent origin without common carotid artery dissection or aneurysm. --Internal carotid artery: No dissection, occlusion or aneurysm. No hemodynamically significant stenosis. --External carotid artery: No acute abnormality. LEFT CAROTID SYSTEM: --Common carotid artery: Widely patent origin without common carotid artery dissection or aneurysm. --Internal carotid artery:No dissection, occlusion or aneurysm. No hemodynamically significant stenosis. --External carotid artery: No acute abnormality. VERTEBRAL ARTERIES: Right dominant configuration. Both origins are normal. No dissection, occlusion or flow-limiting stenosis to the vertebrobasilar confluence. SKELETON: There is no bony spinal canal stenosis. No lytic or blastic lesion. OTHER NECK: Normal pharynx, larynx and major salivary glands. No cervical lymphadenopathy. Unremarkable thyroid gland. UPPER CHEST: No pneumothorax or pleural effusion. No nodules or masses. CTA HEAD FINDINGS ANTERIOR CIRCULATION: --Intracranial internal carotid arteries: Normal. --Anterior cerebral arteries: Normal. Both A1 segments are present. Patent anterior communicating artery. --Middle cerebral arteries: Normal. --Posterior communicating arteries: Absent bilaterally. POSTERIOR CIRCULATION: --Basilar artery: Normal. --Posterior cerebral arteries: Normal. --Superior cerebellar arteries: Normal. --Inferior cerebellar arteries: Normal anterior and posterior inferior cerebellar arteries. VENOUS SINUSES: As permitted by contrast timing, patent. ANATOMIC VARIANTS: None DELAYED PHASE: No parenchymal contrast enhancement. Review of the MIP images confirms the above findings. IMPRESSION: No emergent large vessel occlusion or hemodynamically significant stenosis. Electronically Signed   By: Ulyses Jarred M.D.   On: 08/20/2017 19:06   Mr Jeri Cos And Wo Contrast  Result Date: 08/19/2017 CLINICAL DATA:  Left facial  numbness for 5 days. EXAM: MRI HEAD WITHOUT AND WITH CONTRAST TECHNIQUE: Multiplanar, multiecho pulse  sequences of the brain and surrounding structures were obtained without and with intravenous contrast. CONTRAST:  13mL MULTIHANCE GADOBENATE DIMEGLUMINE 529 MG/ML IV SOLN COMPARISON:  Head CT 08/12/2017 and MRI 05/03/2005 FINDINGS: Brain: There is a 1 cm focus of restricted diffusion, T2 hyperintensity, and faint enhancement in the left middle cerebellar peduncle. There is there is minimal T2 hyperintensity in periventricular white matter adjacent to the right occipital horn which is unchanged from 2007. The ventricles and sulci are normal. A developmental venous anomaly is noted in the anterior left frontal lobe. No intracranial hemorrhage, mass, midline shift, or extra-axial fluid collection is identified. Vascular: Major intracranial vascular flow voids are preserved. Skull and upper cervical spine: Unremarkable bone marrow signal. Sinuses/Orbits: Unremarkable orbits. Scattered paranasal sinus mucous retention cysts. Clear mastoid air cells. Other: None. IMPRESSION: Focus of restricted diffusion and faint enhancement in the left middle cerebellar peduncle. Primary considerations are demyelinating disease (multiple sclerosis) and subacute infarct. There is only a paucity of supratentorial white matter disease which involves right occipital periventricular white matter and is unchanged from 2007. Electronically Signed   By: Logan Bores M.D.   On: 08/19/2017 15:29   Mr Cervical Spine Wo Contrast  Result Date: 08/20/2017 CLINICAL DATA:  Multiple sclerosis, new symptoms. LEFT facial numbness. History of hypertension, hypercholesterolemia. EXAM: MRI CERVICAL SPINE WITHOUT CONTRAST TECHNIQUE: Multiplanar, multisequence MR imaging of the cervical spine was performed. No intravenous contrast was administered. COMPARISON:  MRI of the cervical spine July 05, 2010 FINDINGS: ALIGNMENT: Straightened cervical lordosis.  No  malalignment. VERTEBRAE/DISCS: Vertebral bodies are intact. Intervertebral disc morphology's and signal are normal. Multilevel mild ventral endplate spurring increased from prior examination. No abnormal or acute bone marrow signal. Congenital canal narrowing on the basis of foreshortened pedicles. CORD:Cervical spinal cord is normal morphology and signal characteristics from the cervicomedullary junction to level of T2-3, the most caudal well visualized level. POSTERIOR FOSSA, VERTEBRAL ARTERIES, PARASPINAL TISSUES: No MR findings of ligamentous injury. Vertebral artery flow voids present. Two subcentimeter T2 bright lesions in LEFT brachium pontis. DISC LEVELS: C2-3: Shallow small central disc protrusion without canal stenosis. Mild facet arthropathy. No neural foraminal narrowing. C3-4: Tiny central disc protrusion, uncovertebral hypertrophy and minimal facet arthropathy without canal stenosis. Mild LEFT neural foraminal narrowing. C4-5: Tiny central disc protrusion. No disc bulge, canal stenosis nor neural foraminal narrowing. C5-6: Moderate LEFT subarticular disc protrusion and uncovertebral hypertrophy. No canal stenosis. Mild RIGHT, moderate LEFT neural foraminal narrowing. C6-7: Similar 3 mm broad-based disc bulge asymmetric to the RIGHT. Uncovertebral hypertrophy. Mild-to-moderate canal stenosis. Moderate to severe bilateral neural foraminal narrowing. C7-T1: No disc bulge, canal stenosis nor neural foraminal narrowing. IMPRESSION: 1. No MR findings of demyelination in the cervical spinal cord. 2 LEFT brachium pontis lesions could represent demyelination. Please see MRI of the brain from August 19, 2017 for dedicated findings. 2. Progressed degenerative change of the cervical spine superimposed on congenital canal narrowing. No acute osseous process. 3. Mild-to-moderate canal stenosis C6-7. 4. Neural foraminal narrowing C3-4, C5-6 and C6-7: Moderate to severe at C6-7. Electronically Signed   By: Elon Alas M.D.   On: 08/20/2017 19:56   LDL 117, A1c 5.8,   Assessment: 57 yo M with sudden onset (on awakening) facial numbness and faintly enhancing lesion on MRI. With his risk factors and clinical presentation, I agree with Dr. Leonel Ramsay that small vessel ischemic stroke is more likely than demyelinating disease.   He has completed a 3 day course solumedrol and Ok to be discharged from  neuro standpoint. Will schedule him to see Dr. Felecia Shelling, our [place] specialist to further rule out MS.    Recommendations:  Continue ASA and plavix for 3 weeks and then plavix alone  Continue lipitor 40 for HLD and stroke prevention  Quit smoking  Check BP at home and  Compliant with medication  Will made appointment with Dr. Felecia Shelling to further rule out MS.   Neurology will sign off. Please call with questions. Pt will follow up with Dr. Felecia Shelling at [facility] in about 4 weeks. Thanks for the consult.   Rosalin Hawking, MD PhD Stroke Neurology 08/21/2017 3:51 PM

## 2017-08-21 NOTE — Discharge Summary (Signed)
Physician Discharge Summary  Ruben Allen SRP:[id number] DOB: 1960/08/08 DOA: 08/19/2017  PCP: Redmond School, MD  Admit date: 08/19/2017 Discharge date: 08/21/2017  Admitted From: home Discharge disposition: home   Recommendations for Outpatient Follow-Up:   1. FLP and LFTs-- goal of <70 LDL 2. Smoking cessation 3. Referral made for Dr. Felecia Shelling   Discharge Diagnosis:   Active Problems:   CVA (cerebral vascular accident) ([place])   Demyelinating disease ([place])   Tobacco abuse   HLD (hyperlipidemia)    Discharge Condition: Improved.  Diet recommendation: Low sodium, heart healthy.  Wound care: None.  Code status: Full.   History of Present Illness:   57 year old man with medical problems including ongoing tobacco use ( 1 pack of cigarettes daily since age of 60), hypertension, elevated cardiac risk on statin and aspirin, anxiety on SSRI who presents with left facial numbness. History is obtained via patient report, spouse at bedside, and review of EMR.  At baseline, the patient is independent in his ADLs and IADLs, walks without assistive devices. Works an active job in Scientist, research (life sciences) and receiving locally. He reports the onset of right ear pain 2-3 weeks ago that radiated to his right parietal skull region, to the symptoms he sought medical attention use treated empirically for herpes zoster with Valtrex and given gabapentin for the symptoms. The right sided facial/skull symptoms seemed to improve however he began to develop left-sided facial and chin numbness. He reports being evaluated as local emergency department where CT scan of the head was performed which did not reveal any signs of CVA, no structural abnormality, therefore discharged home. However, his symptoms persisted and was accompanied by generalized fatigue and decreased exercise tolerance, reports he stayed home from work the Friday before admission. He's never had anything like this before, he denies any  difficulty speaking, difficulty walking, difficulty swallowing, denies fevers, chills, dysuria, chest pain, shortness of breath, syncope or palpitations.  Denies ever having history of stroke, cancer, heart failure, diabetes. He did have a cardiac ablation procedure for tachycardia arrhythmia, AVRT back in 2012.     Hospital Course by Problem:   Left facial numbness due to most likely CVA but patient was treated with 3 days of IV steroids for possible de-myelinating disease -patient will follow up with Dr. Felecia Shelling -ASA/plavix for 3 weeks then asa alone per Dr. Leonel Ramsay -CTA head and neck w/o obstructions -LDL goal <70-- change statin -tobacco cessation advised   Medical Consultants:   neuro    Discharge Exam:   Vitals:   08/21/17 0756 08/21/17 1229  BP: 136/75 (!) 142/95  Pulse: 79 84  Resp: 18 18  Temp: 97.9 F (36.6 C) 98.3 F (36.8 C)  SpO2: 98% 97%   Vitals:   08/20/17 2319 08/21/17 0400 08/21/17 0756 08/21/17 1229  BP: 140/71 122/78 136/75 (!) 142/95  Pulse: 75 68 79 84  Resp: 20 18 18 18   Temp: 98.7 F (37.1 C) 97.6 F (36.4 C) 97.9 F (36.6 C) 98.3 F (36.8 C)  TempSrc: Oral Oral Oral Oral  SpO2: 95% 95% 98% 97%  Weight:      Height:        General exam: Appears calm and comfortable- up walking on the unit    The results of significant diagnostics from this hospitalization (including imaging, microbiology, ancillary and laboratory) are listed below for reference.     Procedures and Diagnostic Studies:   Ct Angio Head W Or Wo Contrast  Result Date: 08/20/2017 CLINICAL  DATA:  Stroke follow-up. Stabbing headaches. Left lip numbness. EXAM: CT ANGIOGRAPHY HEAD AND NECK TECHNIQUE: Multidetector CT imaging of the head and neck was performed using the standard protocol during bolus administration of intravenous contrast. Multiplanar CT image reconstructions and MIPs were obtained to evaluate the vascular anatomy. Carotid stenosis measurements (when  applicable) are obtained utilizing NASCET criteria, using the distal internal carotid diameter as the denominator. CONTRAST:  76mL ISOVUE-370 IOPAMIDOL (ISOVUE-370) INJECTION 76% COMPARISON:  None. FINDINGS: CT HEAD FINDINGS Brain: There is no mass, hemorrhage or extra-axial collection. The size and configuration of the ventricles and extra-axial CSF spaces are normal. There is no acute or chronic infarction. The brain parenchyma is normal. Skull: The visualized skull base, calvarium and extracranial soft tissues are normal. Sinuses/Orbits: No fluid levels or advanced mucosal thickening of the visualized paranasal sinuses. No mastoid or middle ear effusion. The orbits are normal. CTA NECK FINDINGS AORTIC ARCH: There is no calcific atherosclerosis of the aortic arch. There is no aneurysm, dissection or hemodynamically significant stenosis of the visualized ascending aorta and aortic arch. Normal variant aortic arch branching pattern with the left vertebral artery arising independently from the aortic arch. The visualized proximal subclavian arteries are widely patent. RIGHT CAROTID SYSTEM: --Common carotid artery: Widely patent origin without common carotid artery dissection or aneurysm. --Internal carotid artery: No dissection, occlusion or aneurysm. No hemodynamically significant stenosis. --External carotid artery: No acute abnormality. LEFT CAROTID SYSTEM: --Common carotid artery: Widely patent origin without common carotid artery dissection or aneurysm. --Internal carotid artery:No dissection, occlusion or aneurysm. No hemodynamically significant stenosis. --External carotid artery: No acute abnormality. VERTEBRAL ARTERIES: Right dominant configuration. Both origins are normal. No dissection, occlusion or flow-limiting stenosis to the vertebrobasilar confluence. SKELETON: There is no bony spinal canal stenosis. No lytic or blastic lesion. OTHER NECK: Normal pharynx, larynx and major salivary glands. No cervical  lymphadenopathy. Unremarkable thyroid gland. UPPER CHEST: No pneumothorax or pleural effusion. No nodules or masses. CTA HEAD FINDINGS ANTERIOR CIRCULATION: --Intracranial internal carotid arteries: Normal. --Anterior cerebral arteries: Normal. Both A1 segments are present. Patent anterior communicating artery. --Middle cerebral arteries: Normal. --Posterior communicating arteries: Absent bilaterally. POSTERIOR CIRCULATION: --Basilar artery: Normal. --Posterior cerebral arteries: Normal. --Superior cerebellar arteries: Normal. --Inferior cerebellar arteries: Normal anterior and posterior inferior cerebellar arteries. VENOUS SINUSES: As permitted by contrast timing, patent. ANATOMIC VARIANTS: None DELAYED PHASE: No parenchymal contrast enhancement. Review of the MIP images confirms the above findings. IMPRESSION: No emergent large vessel occlusion or hemodynamically significant stenosis. Electronically Signed   By: Ulyses Jarred M.D.   On: 08/20/2017 19:06   Ct Angio Neck W Or Wo Contrast  Result Date: 08/20/2017 CLINICAL DATA:  Stroke follow-up. Stabbing headaches. Left lip numbness. EXAM: CT ANGIOGRAPHY HEAD AND NECK TECHNIQUE: Multidetector CT imaging of the head and neck was performed using the standard protocol during bolus administration of intravenous contrast. Multiplanar CT image reconstructions and MIPs were obtained to evaluate the vascular anatomy. Carotid stenosis measurements (when applicable) are obtained utilizing NASCET criteria, using the distal internal carotid diameter as the denominator. CONTRAST:  55mL ISOVUE-370 IOPAMIDOL (ISOVUE-370) INJECTION 76% COMPARISON:  None. FINDINGS: CT HEAD FINDINGS Brain: There is no mass, hemorrhage or extra-axial collection. The size and configuration of the ventricles and extra-axial CSF spaces are normal. There is no acute or chronic infarction. The brain parenchyma is normal. Skull: The visualized skull base, calvarium and extracranial soft tissues are  normal. Sinuses/Orbits: No fluid levels or advanced mucosal thickening of the visualized paranasal sinuses. No mastoid or  middle ear effusion. The orbits are normal. CTA NECK FINDINGS AORTIC ARCH: There is no calcific atherosclerosis of the aortic arch. There is no aneurysm, dissection or hemodynamically significant stenosis of the visualized ascending aorta and aortic arch. Normal variant aortic arch branching pattern with the left vertebral artery arising independently from the aortic arch. The visualized proximal subclavian arteries are widely patent. RIGHT CAROTID SYSTEM: --Common carotid artery: Widely patent origin without common carotid artery dissection or aneurysm. --Internal carotid artery: No dissection, occlusion or aneurysm. No hemodynamically significant stenosis. --External carotid artery: No acute abnormality. LEFT CAROTID SYSTEM: --Common carotid artery: Widely patent origin without common carotid artery dissection or aneurysm. --Internal carotid artery:No dissection, occlusion or aneurysm. No hemodynamically significant stenosis. --External carotid artery: No acute abnormality. VERTEBRAL ARTERIES: Right dominant configuration. Both origins are normal. No dissection, occlusion or flow-limiting stenosis to the vertebrobasilar confluence. SKELETON: There is no bony spinal canal stenosis. No lytic or blastic lesion. OTHER NECK: Normal pharynx, larynx and major salivary glands. No cervical lymphadenopathy. Unremarkable thyroid gland. UPPER CHEST: No pneumothorax or pleural effusion. No nodules or masses. CTA HEAD FINDINGS ANTERIOR CIRCULATION: --Intracranial internal carotid arteries: Normal. --Anterior cerebral arteries: Normal. Both A1 segments are present. Patent anterior communicating artery. --Middle cerebral arteries: Normal. --Posterior communicating arteries: Absent bilaterally. POSTERIOR CIRCULATION: --Basilar artery: Normal. --Posterior cerebral arteries: Normal. --Superior cerebellar  arteries: Normal. --Inferior cerebellar arteries: Normal anterior and posterior inferior cerebellar arteries. VENOUS SINUSES: As permitted by contrast timing, patent. ANATOMIC VARIANTS: None DELAYED PHASE: No parenchymal contrast enhancement. Review of the MIP images confirms the above findings. IMPRESSION: No emergent large vessel occlusion or hemodynamically significant stenosis. Electronically Signed   By: Ulyses Jarred M.D.   On: 08/20/2017 19:06   Mr Jeri Cos And Wo Contrast  Result Date: 08/19/2017 CLINICAL DATA:  Left facial numbness for 5 days. EXAM: MRI HEAD WITHOUT AND WITH CONTRAST TECHNIQUE: Multiplanar, multiecho pulse sequences of the brain and surrounding structures were obtained without and with intravenous contrast. CONTRAST:  24mL MULTIHANCE GADOBENATE DIMEGLUMINE 529 MG/ML IV SOLN COMPARISON:  Head CT 08/12/2017 and MRI 05/03/2005 FINDINGS: Brain: There is a 1 cm focus of restricted diffusion, T2 hyperintensity, and faint enhancement in the left middle cerebellar peduncle. There is there is minimal T2 hyperintensity in periventricular white matter adjacent to the right occipital horn which is unchanged from 2007. The ventricles and sulci are normal. A developmental venous anomaly is noted in the anterior left frontal lobe. No intracranial hemorrhage, mass, midline shift, or extra-axial fluid collection is identified. Vascular: Major intracranial vascular flow voids are preserved. Skull and upper cervical spine: Unremarkable bone marrow signal. Sinuses/Orbits: Unremarkable orbits. Scattered paranasal sinus mucous retention cysts. Clear mastoid air cells. Other: None. IMPRESSION: Focus of restricted diffusion and faint enhancement in the left middle cerebellar peduncle. Primary considerations are demyelinating disease (multiple sclerosis) and subacute infarct. There is only a paucity of supratentorial white matter disease which involves right occipital periventricular white matter and is unchanged  from 2007. Electronically Signed   By: Logan Bores M.D.   On: 08/19/2017 15:29   Mr Cervical Spine Wo Contrast  Result Date: 08/20/2017 CLINICAL DATA:  Multiple sclerosis, new symptoms. LEFT facial numbness. History of hypertension, hypercholesterolemia. EXAM: MRI CERVICAL SPINE WITHOUT CONTRAST TECHNIQUE: Multiplanar, multisequence MR imaging of the cervical spine was performed. No intravenous contrast was administered. COMPARISON:  MRI of the cervical spine July 05, 2010 FINDINGS: ALIGNMENT: Straightened cervical lordosis.  No malalignment. VERTEBRAE/DISCS: Vertebral bodies are intact. Intervertebral disc morphology's and signal  are normal. Multilevel mild ventral endplate spurring increased from prior examination. No abnormal or acute bone marrow signal. Congenital canal narrowing on the basis of foreshortened pedicles. CORD:Cervical spinal cord is normal morphology and signal characteristics from the cervicomedullary junction to level of T2-3, the most caudal well visualized level. POSTERIOR FOSSA, VERTEBRAL ARTERIES, PARASPINAL TISSUES: No MR findings of ligamentous injury. Vertebral artery flow voids present. Two subcentimeter T2 bright lesions in LEFT brachium pontis. DISC LEVELS: C2-3: Shallow small central disc protrusion without canal stenosis. Mild facet arthropathy. No neural foraminal narrowing. C3-4: Tiny central disc protrusion, uncovertebral hypertrophy and minimal facet arthropathy without canal stenosis. Mild LEFT neural foraminal narrowing. C4-5: Tiny central disc protrusion. No disc bulge, canal stenosis nor neural foraminal narrowing. C5-6: Moderate LEFT subarticular disc protrusion and uncovertebral hypertrophy. No canal stenosis. Mild RIGHT, moderate LEFT neural foraminal narrowing. C6-7: Similar 3 mm broad-based disc bulge asymmetric to the RIGHT. Uncovertebral hypertrophy. Mild-to-moderate canal stenosis. Moderate to severe bilateral neural foraminal narrowing. C7-T1: No disc bulge,  canal stenosis nor neural foraminal narrowing. IMPRESSION: 1. No MR findings of demyelination in the cervical spinal cord. 2 LEFT brachium pontis lesions could represent demyelination. Please see MRI of the brain from August 19, 2017 for dedicated findings. 2. Progressed degenerative change of the cervical spine superimposed on congenital canal narrowing. No acute osseous process. 3. Mild-to-moderate canal stenosis C6-7. 4. Neural foraminal narrowing C3-4, C5-6 and C6-7: Moderate to severe at C6-7. Electronically Signed   By: Elon Alas M.D.   On: 08/20/2017 19:56     Labs:   Basic Metabolic Panel: Recent Labs  Lab 08/19/17 1200 08/20/17 0349  NA 138 137  K 3.7 3.8  CL 96* 98  CO2 29 27  GLUCOSE 108* 159*  BUN 19 23*  CREATININE 1.06 1.02  CALCIUM 9.4 9.5   GFR Estimated Creatinine Clearance: 78.2 mL/min (by C-G formula based on SCr of 1.02 mg/dL). Liver Function Tests: No results for input(s): AST, ALT, ALKPHOS, BILITOT, PROT, ALBUMIN in the last 168 hours. No results for input(s): LIPASE, AMYLASE in the last 168 hours. No results for input(s): AMMONIA in the last 168 hours. Coagulation profile No results for input(s): INR, PROTIME in the last 168 hours.  CBC: Recent Labs  Lab 08/19/17 1200 08/20/17 0349  WBC 13.3* 14.3*  NEUTROABS 9.6*  --   HGB 16.5 16.5  HCT 49.8 49.3  MCV 92.4 90.6  PLT 241 264   Cardiac Enzymes: No results for input(s): CKTOTAL, CKMB, CKMBINDEX, TROPONINI in the last 168 hours. BNP: Invalid input(s): POCBNP CBG: No results for input(s): GLUCAP in the last 168 hours. D-Dimer No results for input(s): DDIMER in the last 72 hours. Hgb A1c Recent Labs    08/20/17 0349  HGBA1C 5.8*   Lipid Profile Recent Labs    08/20/17 0349  CHOL 189  HDL 60  LDLCALC 117*  TRIG 58  CHOLHDL 3.2   Thyroid function studies No results for input(s): TSH, T4TOTAL, T3FREE, THYROIDAB in the last 72 hours.  Invalid input(s): FREET3 Anemia work  up Recent Labs    08/20/17 [place]   Microbiology No results found for this or any previous visit (from the past 240 hour(s)).   Discharge Instructions:   Discharge Instructions    Ambulatory referral to Neurology   Complete by:  As directed    An appointment is requested in approximately: 4 weeks. We think he has stroke but MRI suggest MS vs. Stroke. Will let Dr. Felecia Shelling to  rule out MS. Thanks.   Diet - low sodium heart healthy   Complete by:  As directed    Increase activity slowly   Complete by:  As directed      Allergies as of 08/21/2017   No Known Allergies     Medication List    STOP taking these medications   ibuprofen 200 MG tablet Commonly known as:  ADVIL,MOTRIN   pravastatin 40 MG tablet Commonly known as:  PRAVACHOL   valACYclovir 500 MG tablet Commonly known as:  VALTREX     TAKE these medications   amLODipine 2.5 MG tablet Commonly known as:  NORVASC Take 2.5 mg by mouth daily.   aspirin 81 MG tablet Take 81 mg by mouth daily.   atorvastatin 40 MG tablet Commonly known as:  LIPITOR Take 1 tablet (40 mg total) by mouth daily at 6 PM.   citalopram 20 MG tablet Commonly known as:  CELEXA Take 20 mg by mouth daily.   clopidogrel 75 MG tablet Commonly known as:  PLAVIX Take 1 tablet (75 mg total) by mouth daily. Start taking on:  08/22/2017   dicyclomine 10 MG capsule Commonly known as:  BENTYL TAKE ONE CAPSULE BY MOUTH TWICE DAILY BEFORE MEAL(S)   hydrochlorothiazide 25 MG tablet Commonly known as:  HYDRODIURIL Take 25 mg by mouth daily.   HYDROcodone-acetaminophen 5-325 MG tablet Commonly known as:  NORCO/VICODIN Take 1 tablet by mouth every 4 (four) hours as needed. What changed:  reasons to take this   loratadine 10 MG tablet Commonly known as:  CLARITIN Take 10 mg by mouth daily.   nicotine 21 mg/24hr patch Commonly known as:  NICODERM CQ - dosed in mg/24 hours Place 1 patch (21 mg total) onto the skin daily. Start  taking on:  08/22/2017   vitamin C 500 MG tablet Commonly known as:  ASCORBIC ACID Take 500 mg by mouth daily.      Follow-up Information    Redmond School, MD Follow up in 1 week(s).   Specialty:  Internal Medicine Contact information: [address] [place] [address] [phone]        Britt Bottom, MD. Schedule an appointment as soon as possible for a visit in 4 week(s).   Specialty:  Neurology Why:  referral has been made for follow up Contact information: [place] [address] [phone]            Time coordinating discharge: 35 min  Signed:  Geradine Girt  [facility] 08/21/2017, 4:35 PM

## 2017-08-21 NOTE — Care Management Note (Signed)
Case Management Note  Patient Details  Name: AMAZIAH RAISANEN MRN: [id number] Date of Birth: [birth year]  Subjective/Objective:                    Action/Plan: Pt has finished his course of IV solumedrol so will not require [facility] services. Pam with [facility] IV therapy updated.  Pt discharging home with self care. No f/u per PT.  Wife to provide transportation home.   Expected Discharge Date:  08/21/17               Expected Discharge Plan:  [place]  In-House Referral:     Discharge planning Services  CM Consult  Post Acute Care Choice:  Home Health Choice offered to:  Patient, Spouse  DME Arranged:    DME Agency:     HH Arranged:    [place] Agency:     Status of Service:  Completed, signed off  If discussed at [employer] of Stay Meetings, dates discussed:    Additional Comments:  Pollie Friar, RN 08/21/2017, 3:44 PM

## 2017-08-21 NOTE — Plan of Care (Signed)
Adequate for discharge.

## 2017-08-27 ENCOUNTER — Other Ambulatory Visit: Payer: Self-pay | Admitting: *Deleted

## 2017-08-27 NOTE — Patient Outreach (Signed)
[facility] [facility]) Care Management  08/27/2017  Ruben Allen [birth year] [id number]   Subjective: Telephone call to patient's home number, no answer, left HIPAA compliant voicemail message, and requested call back.    Objective:Per KPN (Knowledge Performance Now, point of care tool), [employer] iCollaborate, and chart review, patient hospitalized  08/19/17 - 08/21/17 for CVA (cerebral vascular accident).   Patient also has a history of tobacco abuse, hyperlipidemia, and Demyelinating disease.    Assessment: Received [employer] Transition of care referral on 08/27/17.   Transition of care follow up pending patient contact.      Plan:  RNCM will send unsuccessful outreach  letter, [facility] pamphlet, will call patient for 2nd telephone outreach attempt, transition of care follow up, and proceed with case closure, within 10 business days if no return call.       Glenora Morocho H. Annia Friendly, BSN, [place] Management [facility] Telephonic CM Phone: [phone] Fax: [phone]

## 2017-08-28 ENCOUNTER — Other Ambulatory Visit: Payer: Self-pay | Admitting: *Deleted

## 2017-08-28 NOTE — Patient Outreach (Signed)
[place] [facility]) Care Management  08/28/2017  Ruben Allen 1960-12-06 [id number]   Subjective: Telephone call to patient's home number, no answer, left HIPAA compliant voicemail message, and requested call back.    Objective:Per KPN (Knowledge Performance Now, point of care tool), [employer] iCollaborate, and chart review, patient hospitalized  08/19/17 - 08/21/17 for CVA (cerebral vascular accident).   Patient also has a history of tobacco abuse, hyperlipidemia, and Demyelinating disease.    Assessment: Received [employer] Transition of care referral on 08/27/17.   Transition of care follow up pending patient contact.      Plan:  RNCM has sent unsuccessful outreach  letter, [facility] pamphlet, will call patient for 3rd telephone outreach attempt, transition of care follow up, and proceed with case closure, within 10 business days if no return call.      [place] Kyler H. Annia Friendly, BSN, [place] Management [facility] Telephonic CM Phone: [phone] Fax: [phone]

## 2017-08-29 ENCOUNTER — Ambulatory Visit: Payer: Managed Care, Other (non HMO) | Admitting: Neurology

## 2017-08-29 ENCOUNTER — Encounter

## 2017-08-29 ENCOUNTER — Other Ambulatory Visit: Payer: Self-pay | Admitting: *Deleted

## 2017-08-29 NOTE — Patient Outreach (Addendum)
[place] [facility]) Care Management  08/29/2017  Ruben Allen 1960/04/25 [id number]   Subjective:Telephone call to patient's mobile number, no answer, left HIPAA compliant voicemail message, and requested call back. Received voicemail from patient's wife Ruben Allen), states she is returning call for her husband Ruben Allen, and requested call back on mobile number. Telephone call to patient's mobile number, spoke with patient's wife Ruben Allen), states husband is not available, she is currently at work, husband had received telephone messages, requested wife to return call on his behalf,  and wife asked for the nature of the call.   RNCM advised wife unable to discuss nature of call without husband's authorization, wife voices understanding, and states she will give husband message to call back.   RNCM left HIPAA compliant message with wife for husband and requested call back.  Reviewed 05/31/16 Release of Information document in Epic/ [facility] and document does not specify if patient is authorizing all [facility] [facility] to release information to wife.   RNCM will obtain verbal consent if patient returns call.     Objective:Per KPN (Knowledge Performance Now, point of care tool), [employer] iCollaborate, and chart review, patient hospitalized8/4/19 - 08/21/17 forCVA (cerebral vascular accident). Patient also has a history of tobacco abuse, hyperlipidemia, and Demyelinating disease.    Assessment: Received [employer] Transition of care referral on 08/27/17.Transition of care follow up pending patient contact.     Plan:RNCM has sent unsuccessful outreach letter, [facility] pamphlet, and will proceed with case closure, within 10 business days if no return call.      Joelie Schou H. Annia Friendly, BSN, [place] Management [facility] Telephonic CM Phone: [phone] Fax: [phone]

## 2017-08-30 ENCOUNTER — Encounter: Payer: Self-pay | Admitting: *Deleted

## 2017-08-30 ENCOUNTER — Other Ambulatory Visit: Payer: Self-pay | Admitting: *Deleted

## 2017-08-30 NOTE — Patient Outreach (Addendum)
[facility]) Care Management  08/30/2017  Ruben Allen April 28, 1960 [id number]   Subjective:  Received voicemail from Ruben Allen, stated his name, date of birth, and address.   Patient gave verbal authorization for this RNCM to speak with his wife Ruben Allen as needed regarding his healthcare.   Telephone call to patient's wife mobile number, spoke with wife Ruben Allen) Ruben Allen), stated patient's name, date of birth, and address.   Discussed [facility] Care Management [employer] Transition of care follow up, patient voiced understanding, and is in agreement to follow up on patient's behalf.  Wife states patient states he is doing worse since he got home from the hospital, none of the symptoms he was having in the hospital have gotten any better, went to primary MD on 08/28/17 to follow up on symptoms.  States the following symptoms reported to MD ( brain fog, feeling pressure in head, pins / needle feeling in head).   States patient's blood pressure  (128/81) at MD's office , was good for patient since his hospitalization, labs drawn, test performed, and MD will call her with the results.   Wife is aware of signs / symptoms to report to MD and to notify MD immediately if any of the previously reported signs/ symptoms increase.   Wife states patient was treated for Multiple Sclerosis (MS) exacerbation and CVA in the hospital, MD has not confirmed these diagnosis, has to follow up visit with neurologist on 09/20/17 to rule MS in or out.   Wife states it is hard to see her husband having problems like this and not knowing what is going on.  RNCM encouraged wife to continue doing what she is doing, supporting her husband through this difficult time, being a great caregiver, asking questions, and being a patient advocate.  Wife voices understanding and states she is very appreciative of the words of encouragement.   States patient is smoking cigarettes occasionally, patient aware of the  issues this may cause with current condition, takes off nicotine patch when he has to have a cigarette, and wife will continue to encourage him not to smoke.   Wife states she will talk with primary MD to see if the nicotine patch dosage can be adjusted or any other adjustments can be made to assist patient with his smoking urge.   Wife states patient  is able to manage self care and has assistance as needed with activities of daily living / home management.   Wife voices understanding of patient's medical diagnosis and treatment plan.  States patient also has a history of hypertension.   States she is accessing her [name] benefits as needed via member services number on back of card or through [email or web address] on patient's behalf as needed.   Wife states patient does not have any education material, transition of care, care coordination, disease management, disease monitoring, transportation, community resource, or pharmacy needs at this time.   States she is very appreciative of the follow up and is in agreement to receive [place] Management information on patient's behalf.      Objective:Per KPN (Knowledge Performance Now, point of care tool), [employer] iCollaborate, and chart review, patient hospitalized8/4/19 - 08/21/17 forCVA (cerebral vascular accident). Patient also has a history of tobacco abuse, hyperlipidemia, and Demyelinating disease.    Assessment: Received [employer] Transition of care referral on 08/27/17.Transition of care follow up completed, no care management needs, and will proceed with case closure.      Plan:RNCM will send patient  successful outreach letter, [facility] pamphlet, and magnet. RNCM will complete case closure due to follow up completed / no care management needs.        Ruben Allen, BSN, [place] [facility] Telephonic CM Phone: [phone] Fax: [phone]

## 2017-09-20 ENCOUNTER — Other Ambulatory Visit: Payer: Self-pay

## 2017-09-20 ENCOUNTER — Encounter: Payer: Self-pay | Admitting: Neurology

## 2017-09-20 ENCOUNTER — Ambulatory Visit: Payer: Managed Care, Other (non HMO) | Admitting: Neurology

## 2017-09-20 VITALS — BP 132/75 | HR 77 | Resp 16 | Ht 68.0 in | Wt 171.5 lb

## 2017-09-20 DIAGNOSIS — G379 Demyelinating disease of central nervous system, unspecified: Secondary | ICD-10-CM

## 2017-09-20 DIAGNOSIS — R200 Anesthesia of skin: Secondary | ICD-10-CM | POA: Diagnosis not present

## 2017-09-20 DIAGNOSIS — H5319 Other subjective visual disturbances: Secondary | ICD-10-CM

## 2017-09-20 NOTE — Progress Notes (Signed)
[facility]  PATIENT: Ruben Allen DOB: [birth year]  REFERRING DOCTOR OR PCP: Dr. Gerarda Fraction SOURCE: Patient, notes from primary care, imaging and lab reports from recent hospitalization, MRI and CT images personally reviewed  _________________________________   HISTORICAL  CHIEF COMPLAINT:  Chief Complaint  Patient presents with  . Numbness    Ruben Allen is here with his wife [name] for eval of left sided facial numbness, onset approx. 08/05/17. Fatigue the next wk.  MRI brain, CT angio neck and CT angio head done. Pt. here for CVA vs MS. He was admitted to [facility] and had 3 days of IV SM which he sts. id not immed. help the numbness.  Numbness began resolving about a wk. later and is now only in the left corner of his mouth, and sts. the inside of his mouth feels "weird," like his tongue is burned./fim    HISTORY OF PRESENT ILLNESS:  I had the pleasure seeing patient, Ruben Allen, and [facility] for a neurologic consultation regarding his left-sided facial numbness that began 08/05/2017 and abnormal MRI.  He was seen at the [facility] emergency room 08/12/2017 after presenting with left-sided facial numbness 08/05/2017.    On 08/04/17, he felt just a little numbness but when her woke up with severe numbness in the lips, inside mouth, tongue, chin and up to the left ear.   He had very mild blurry vision.    He also noted more tiredness.   He felt slightly lightheaded.   He had mild gait ataxia but no clumsiness in the arms or legs.   CT scan was normal but MRI of the brain performed 08/19/2017 showed a faintly enhancing lesion in the left middle cerebellar peduncle and one periventricular focus in the parieto-occipital periventricular white matter that was also present on the 2007 MRI.   He received 3 days of IV Solu-Medrol.  While in the hospital, he also had a CT angiogram of the neck and head and an MRI of the cervical spine.  There is no evidence of  demyelination but he did have significant degenerative changes with mild to moderate spinal stenosis at C6-C7 and foraminal narrowing at most of the other levels, moderately severe at C6-C7.  The CT angiograms were normal.  He was discharged after his steroids were completed.    He did not note benefit during those 3 days in the hospital and he took the next week off.   He began to improve about a week or two later.  His fatigue was still severe. He still felt wobbly.  Symptoms have improved very slowly.   Currently he only has numbness in the corner of his mouth on the left.  Additionally the inside of his mouth feels weird.   He still has a lot of fatigue.  Eleven years ago he had vertigo x 6 months and was diagnosed with vestibular disorder,    MRI showed one PV lesion.   Gait is back to baseline.   He denies clumsiness, weakness or numbness below his neck.    Vision is fine as far as acuity.  He has not noted color vision changes and has no definite history of optic neuritis, though today, color vision is reduced OS.    Bladder function is ok most days but he has some days with mild frequency.   He has no nocturia.  His fatigue started the same time as other symptoms.   He is sleeping later and feels apathetic.  No definite depression.   He has some irritability, more this year.    He is not noting cognitive issues.     He has sleep maintenance insomnia 1/2 the nights.      His sister has mixed connective tissue disorder and has been very symptomatic.  I personally reviewed the MRIs of the brain, cervical spine and the CT angiograms.  The MRI of the brain shows a focus in the left middle cerebellar peduncle/posterior pons.  There was subtle enhancement seen on the coronal postcontrast images.   The focus also appeared on diffusion-weighted images but was not hypointense on ADC maps additionally, there are a couple T2/FLAIR hyperintense foci in the hemispheres in the periventricular region  parietal/occipital lobes (2 of the 3 were present on the previous MRI from 2007).   CT angiogram of the head and neck were normal.  MRI of the cervical spine did not show any demyelination.  There is spinal stenosis at C6-C7 and bilateral foraminal narrowing.   Standard labs and B12 were normal or near normal with elevated glucose (he was on steroids).  The MRI from 05/05/2005 was also reviewed.  The brainstem look normal.  The cerebellum was normal.  There were 2 periventricular foci in the right parietal/occipital region    REVIEW OF SYSTEMS: Constitutional: No fevers, chills, sweats, or change in appetite.  He notes fatigue Eyes: No visual changes, double vision, eye pain Ear, nose and throat: No hearing loss, ear pain, nasal congestion, sore throat Cardiovascular: No chest pain, palpitations Respiratory: No shortness of breath at rest or with exertion.   No wheezes GastrointestinaI: No nausea, vomiting, diarrhea, abdominal pain, fecal incontinence Genitourinary: No dysuria, urinary retention or frequency.  No nocturia. Musculoskeletal: No neck pain, back pain Integumentary: No rash, pruritus, skin lesions Neurological: as above Psychiatric: No depression at this time.  No anxiety Endocrine: No palpitations, diaphoresis, change in appetite, change in weigh or increased thirst Hematologic/Lymphatic: No anemia, purpura, petechiae. Allergic/Immunologic: No itchy/runny eyes, nasal congestion, recent allergic reactions, rashes  ALLERGIES: No Known Allergies  HOME MEDICATIONS:  Current Outpatient Medications:  .  amLODipine (NORVASC) 2.5 MG tablet, Take 2.5 mg by mouth daily., Disp: , Rfl:  .  citalopram (CELEXA) 20 MG tablet, Take 20 mg by mouth daily., Disp: , Rfl:  .  clopidogrel (PLAVIX) 75 MG tablet, Take 1 tablet (75 mg total) by mouth daily., Disp: 21 tablet, Rfl: 0 .  dicyclomine (BENTYL) 10 MG capsule, TAKE ONE CAPSULE BY MOUTH TWICE DAILY BEFORE MEAL(S), Disp: 60 capsule, Rfl:  5 .  hydrochlorothiazide (HYDRODIURIL) 25 MG tablet, Take 25 mg by mouth daily., Disp: , Rfl:  .  loratadine (CLARITIN) 10 MG tablet, Take 10 mg by mouth daily., Disp: , Rfl:  .  pravastatin (PRAVACHOL) 40 MG tablet, Take 40 mg by mouth daily., Disp: , Rfl:  .  vitamin B-12 (CYANOCOBALAMIN) 1000 MCG tablet, Take 1,000 mcg by mouth daily., Disp: , Rfl:  .  vitamin C (ASCORBIC ACID) 500 MG tablet, Take 500 mg by mouth daily., Disp: , Rfl:   PAST MEDICAL HISTORY: Past Medical History:  Diagnosis Date  . High cholesterol   . Hypertension    x 5 yrs  . Vision abnormalities     PAST SURGICAL HISTORY: Past Surgical History:  Procedure Laterality Date  . cardiac cath    . COLONOSCOPY N/A 02/19/2013   Procedure: COLONOSCOPY;  Surgeon: Rogene Houston, MD;  Location: [facility];  Service: Endoscopy;  Laterality: N/A;  145-moved to 320 Ann to notify pt  . GANGLION CYST EXCISION Left   . Thumb surgery      FAMILY HISTORY: Family History  Problem Relation Age of Onset  . Heart disease Mother   . COPD Mother   . Colon cancer Father   . Other Sister        "some form of autoimmune disease"  . Diabetes type II Sister   . COPD Sister   . Other Brother 2       Motorcycle Accident  . Other Brother 31       Heroin OD  . Colon cancer Brother   . Multiple sclerosis Cousin   . Multiple sclerosis Cousin     SOCIAL HISTORY:  Social History   Socioeconomic History  . Marital status: Married    Spouse name: Not on file  . Number of children: Not on file  . Years of education: Not on file  . Highest education level: Not on file  Occupational History  . Not on file  Social Needs  . Financial resource strain: Not on file  . Food insecurity:    Worry: Not on file    Inability: Not on file  . Transportation needs:    Medical: Not on file    Non-medical: Not on file  Tobacco Use  . Smoking status: Current Every Day Smoker    Packs/day: 1.00    Years: 25.00    Pack years: 25.00      Types: Cigarettes  . Smokeless tobacco: Former Systems developer    Quit date: 01/16/2012  . Tobacco comment: quit a couple of years after smoking 91yrs.  Substance and Sexual Activity  . Alcohol use: Yes    Comment: weekends  . Drug use: No  . Sexual activity: Not on file  Lifestyle  . Physical activity:    Days per week: Not on file    Minutes per session: Not on file  . Stress: Not on file  Relationships  . Social connections:    Talks on phone: Not on file    Gets together: Not on file    Attends religious service: Not on file    Active member of club or organization: Not on file    Attends meetings of clubs or organizations: Not on file    Relationship status: Not on file  . Intimate partner violence:    Fear of current or ex partner: Not on file    Emotionally abused: Not on file    Physically abused: Not on file    Forced sexual activity: Not on file  Other Topics Concern  . Not on file  Social History Narrative  . Not on file     PHYSICAL EXAM  Vitals:   09/20/17 0852  BP: 132/75  Pulse: 77  Resp: 16  Weight: 171 lb 8 oz (77.8 kg)  Height: 5\' 8"  (1.727 m)    Body mass index is 26.08 kg/m.   General: The patient is well-developed and well-nourished and in no acute distress  Eyes:  Funduscopic exam shows normal optic discs and retinal vessels.  Neck: The neck is supple, no carotid bruits are noted.  The neck is nontender.  Cardiovascular: The heart has a regular rate and rhythm with a normal S1 and S2. There were no murmurs, gallops or rubs. Lungs are clear to auscultation.  Skin: Extremities are without significant edema.  Musculoskeletal:  Back is nontender  Neurologic Exam  Mental status: The patient  is alert and oriented x 3 at the time of the examination. The patient has apparent normal recent and remote memory, with an apparently normal attention span and concentration ability.   Speech is normal.  Cranial nerves: Extraocular movements are full.  Pupils are equal, round, and reactive to light and accomodation.  Visual fields are full.  Facial symmetry is present. He has slightly altered sensation near the left lip and the left tongue.   Right sensation is normal.   .Facial strength is normal.  Trapezius and sternocleidomastoid strength is normal. No dysarthria is noted.  The tongue is midline, and the patient has symmetric elevation of the soft palate. No obvious hearing deficits are noted.  Motor:  Muscle bulk is normal.   Tone is normal. Strength is  5 / 5 in all 4 extremities.   Sensory: Sensory testing is intact to pinprick, soft touch and vibration sensation in all 4 extremities.  Coordination: Cerebellar testing reveals good finger-nose-finger and heel-to-shin bilaterally.  Gait and station: Station is normal.   Gait is normal. Tandem gait is normal. Romberg is negative.   Reflexes: Deep tendon reflexes are symmetric and normal in arms and increased with legs with mild crossed adductor responses,.   Plantar responses are flexor.    DIAGNOSTIC DATA (LABS, IMAGING, TESTING) - I reviewed patient records, labs, notes, testing and imaging myself where available.  Lab Results  Component Value Date   WBC 14.3 (H) 08/20/2017   HGB 16.5 08/20/2017   HCT 49.3 08/20/2017   MCV 90.6 08/20/2017   PLT 264 08/20/2017      Component Value Date/Time   NA 137 08/20/2017 0349   K 3.8 08/20/2017 0349   CL 98 08/20/2017 0349   CO2 27 08/20/2017 0349   GLUCOSE 159 (H) 08/20/2017 0349   BUN 23 (H) 08/20/2017 0349   CREATININE 1.02 08/20/2017 0349   CALCIUM 9.5 08/20/2017 0349   PROT 6.8 05/02/2016 0938   ALBUMIN 4.4 05/02/2016 0938   AST 14 05/02/2016 0938   ALT 18 05/02/2016 0938   ALKPHOS 56 05/02/2016 0938   BILITOT 0.4 05/02/2016 0938   GFRNONAA >60 08/20/2017 0349   GFRAA >60 08/20/2017 0349   Lab Results  Component Value Date   CHOL 189 08/20/2017   HDL 60 08/20/2017   LDLCALC 117 (H) 08/20/2017   TRIG 58 08/20/2017    CHOLHDL 3.2 08/20/2017   Lab Results  Component Value Date   HGBA1C 5.8 (H) 08/20/2017   Lab Results  Component Value Date   [id number] 694 08/20/2017   No results found for: TSH     ASSESSMENT AND PLAN  Demyelinating disease ([place]) - Plan: Visual evoked potential test  Left facial numbness  Visual distortion - Plan: Visual evoked potential test   In summary, Mr. Haliburton is a 57 year old man with left facial numbness who had an acute focus in the posterior left pons/middle cerebellar peduncle.  He also had had some mild gait ataxia during the initial symptoms.  About a year ago he noted some mild visual disturbances, not bad enough to seek medical attention.  I had a long discussion with him and his wife the significance of the findings.  The focus is more consistent with a demyelinating plaque then with a stroke and I feel there is about a 75% chance that he has MS.  I would like to have one more piece of information to confirm the diagnosis.  We will check a visual evoked response.  If  there is a symmetry consistent with a history of optic neuritis, then MS is confirmed and I would start him on a disease modifying therapy.  If it is normal we will check a lumbar puncture and evaluate the CSF for the presence of oligoclonal bands and elevated IgG index.  If that is positive, then MS is confirmed and if negative we will follow-up with another MRI in 6 to 9 months to see if there has been progression over time.  He will return to see me in several months but we will call him back sooner based on the results of the studies if consistent with MS to start a disease modifying therapy.  He should call us if he has any new or worsening neurologic symptoms.  If fatigue worsens we could consider armodafinil or a stimulant.  Thank you for asking me to see Mr. Hasler.  Please let me know if I can be of further assistance with him or other patients in th  Artina Minella A. Felecia Shelling, MD, PhD,  FAAN Certified in Neurology, Clinical Neurophysiology, Sleep Medicine, Pain Medicine and Neuroimaging Director, [place] at [facility] [address], [place], [place] [address] [phone]

## 2017-10-01 ENCOUNTER — Ambulatory Visit: Payer: Managed Care, Other (non HMO) | Admitting: Neurology

## 2017-10-01 DIAGNOSIS — H5319 Other subjective visual disturbances: Secondary | ICD-10-CM | POA: Diagnosis not present

## 2017-10-01 DIAGNOSIS — G379 Demyelinating disease of central nervous system, unspecified: Secondary | ICD-10-CM | POA: Diagnosis not present

## 2017-10-01 NOTE — Progress Notes (Signed)
    History:  He is a 57 year old man with numbness and possible multiple sclerosis who had an episode of altered vision on the left about 1 year ago.  Description: The visual evoked response test was performed today using 32 x 32 check sizes. The absolute latencies for the P100 wave forms were 114 ms on the right and 124 ms on the left.. The amplitudes for the P100 wave forms were within normal limits bilaterally. The visual acuity was 20/20 OD and 20/30 OS uncorrected.  Impression:  The visual evoked response test shows slowing along the left anterior visual pathway consistent with a history of left optic neuritis.

## 2017-10-03 ENCOUNTER — Telehealth: Payer: Self-pay | Admitting: Neurology

## 2017-10-03 NOTE — Telephone Encounter (Signed)
I spoke to Ruben Allen about the visual evoked potential being abnormal showing slowing in the left visual pathway.  This was the eye that was symptomatic last year.  Additionally he had reduced visual acuity.  Combined with the MRI showing an enhancing lesion in the left middle cerebellar peduncle, he most likely has MS and we could consider starting a disease modifying therapy.  Please see if we can get him in sometime in the next 2 to 3 weeks.

## 2017-10-04 NOTE — Telephone Encounter (Signed)
Spoke with Barnetta Chapel (wife--on hippa) and gave appt 10/16/17, arrival time of 0830 for a 9am appt/fim

## 2017-10-16 ENCOUNTER — Other Ambulatory Visit: Payer: Self-pay

## 2017-10-16 ENCOUNTER — Other Ambulatory Visit: Payer: Self-pay | Admitting: *Deleted

## 2017-10-16 ENCOUNTER — Other Ambulatory Visit: Payer: Self-pay | Admitting: Neurology

## 2017-10-16 ENCOUNTER — Encounter: Payer: Self-pay | Admitting: Neurology

## 2017-10-16 ENCOUNTER — Ambulatory Visit: Payer: Managed Care, Other (non HMO) | Admitting: Neurology

## 2017-10-16 VITALS — BP 138/76 | HR 71 | Resp 16 | Ht 68.0 in | Wt 175.0 lb

## 2017-10-16 DIAGNOSIS — R94112 Abnormal visually evoked potential [VEP]: Secondary | ICD-10-CM

## 2017-10-16 DIAGNOSIS — R9089 Other abnormal findings on diagnostic imaging of central nervous system: Secondary | ICD-10-CM | POA: Diagnosis not present

## 2017-10-16 DIAGNOSIS — R200 Anesthesia of skin: Secondary | ICD-10-CM

## 2017-10-16 DIAGNOSIS — H5319 Other subjective visual disturbances: Secondary | ICD-10-CM

## 2017-10-16 DIAGNOSIS — R9389 Abnormal findings on diagnostic imaging of other specified body structures: Secondary | ICD-10-CM

## 2017-10-16 DIAGNOSIS — G379 Demyelinating disease of central nervous system, unspecified: Secondary | ICD-10-CM

## 2017-10-16 NOTE — Progress Notes (Signed)
[facility]  PATIENT: Ruben Allen DOB: [birth year]  REFERRING DOCTOR OR PCP: Dr. Gerarda Fraction SOURCE: Patient, notes from primary care, imaging and lab reports from recent hospitalization, MRI and CT images personally reviewed  _________________________________   HISTORICAL  CHIEF COMPLAINT:  Chief Complaint  Patient presents with  . Abnormal MRI Brain    Here to discuss the increased liklihood of having MS, based on abnormal MRI, VEP, visual disturbance, and discuss tx. options/fim  . Abnormal VEP    HISTORY OF PRESENT ILLNESS:  Update 10/16/2017: Ruben Allen is a 57 yo man with visual disturbance in 2018 and left-sided facial numbness that began 08/05/2017.   I saw him 4 weeks ago for possible MS.    He also had an abnormal MRI including an enhancing lesion in the middle cerebral peduncle and one periventricular focus.    He recently had a VEP showing slowing along the left anterior optic pathway, c/w h/o optic neuritis.   Taken together, there is a high likelihood of  RRMS and we could consider a treatment to prevent more relapses and disability progression.     The facial numbness has almost completely resolved except for a strange paresthesia.     Before starting a long-term medication, he would like to be completely sure of the diagnosis.  We discussed doing a lumbar puncture to see if he has oligoclonal bands and elevated IgG index diagnosis further.  Alternatively, we could reimage in 6 to 9 months and see if there or progressive changes consistent with MS.  He would like to proceed with the lumbar puncture.      I attempted to do a lumbar puncture at the L3-L4 level was unable to enter the interspace on several passes very therefore, we will set him up [facility] imaging to have the procedure performed using fluoroscopic guidance.   From 09/20/2017: He was seen at the [facility] emergency room 08/12/2017 after presenting with left-sided facial numbness  08/05/2017.    On 08/04/17, he felt just a little numbness but when her woke up with severe numbness in the lips, inside mouth, tongue, chin and up to the left ear.   He had very mild blurry vision.    He also noted more tiredness.   He felt slightly lightheaded.   He had mild gait ataxia but no clumsiness in the arms or legs.   CT scan was normal but MRI of the brain performed 08/19/2017 showed a faintly enhancing lesion in the left middle cerebellar peduncle and one periventricular focus in the parieto-occipital periventricular white matter that was also present on the 2007 MRI.   He received 3 days of IV Solu-Medrol.  While in the hospital, he also had a CT angiogram of the neck and head and an MRI of the cervical spine.  There is no evidence of demyelination but he did have significant degenerative changes with mild to moderate spinal stenosis at C6-C7 and foraminal narrowing at most of the other levels, moderately severe at C6-C7.  The CT angiograms were normal.  He was discharged after his steroids were completed.    He did not note benefit during those 3 days in the hospital and he took the next week off.   He began to improve about a week or two later.  His fatigue was still severe. He still felt wobbly.  Symptoms have improved very slowly.   Currently he only has numbness in the corner of his mouth on the left.  Additionally the inside of his mouth feels weird.   He still has a lot of fatigue.  Eleven years ago he had vertigo x 6 months and was diagnosed with vestibular disorder,    MRI showed one PV lesion.   Gait is back to baseline.   He denies clumsiness, weakness or numbness below his neck.    Vision is fine as far as acuity.  He has not noted color vision changes and has no definite history of optic neuritis, though today, color vision is reduced OS.    Bladder function is ok most days but he has some days with mild frequency.   He has no nocturia.  His fatigue started the same time as other  symptoms.   He is sleeping later and feels apathetic.   No definite depression.   He has some irritability, more this year.    He is not noting cognitive issues.     He has sleep maintenance insomnia 1/2 the nights.      His sister has mixed connective tissue disorder and has been very symptomatic.  I personally reviewed the MRIs of the brain, cervical spine and the CT angiograms.  The MRI of the brain shows a focus in the left middle cerebellar peduncle/posterior pons.  There was subtle enhancement seen on the coronal postcontrast images.   The focus also appeared on diffusion-weighted images but was not hypointense on ADC maps additionally, there are a couple T2/FLAIR hyperintense foci in the hemispheres in the periventricular region parietal/occipital lobes (2 of the 3 were present on the previous MRI from 2007).   CT angiogram of the head and neck were normal.  MRI of the cervical spine did not show any demyelination.  There is spinal stenosis at C6-C7 and bilateral foraminal narrowing.   Standard labs and B12 were normal or near normal with elevated glucose (he was on steroids).  The MRI from 05/05/2005 was also reviewed.  The brainstem look normal.  The cerebellum was normal.  There were 2 periventricular foci in the right parietal/occipital region    REVIEW OF SYSTEMS: Constitutional: No fevers, chills, sweats, or change in appetite.  He notes fatigue Eyes: No visual changes, double vision, eye pain Ear, nose and throat: No hearing loss, ear pain, nasal congestion, sore throat Cardiovascular: No chest pain, palpitations Respiratory: No shortness of breath at rest or with exertion.   No wheezes GastrointestinaI: No nausea, vomiting, diarrhea, abdominal pain, fecal incontinence Genitourinary: No dysuria, urinary retention or frequency.  No nocturia. Musculoskeletal: No neck pain, back pain Integumentary: No rash, pruritus, skin lesions Neurological: as above Psychiatric: No depression at  this time.  No anxiety Endocrine: No palpitations, diaphoresis, change in appetite, change in weigh or increased thirst Hematologic/Lymphatic: No anemia, purpura, petechiae. Allergic/Immunologic: No itchy/runny eyes, nasal congestion, recent allergic reactions, rashes  ALLERGIES: No Known Allergies  HOME MEDICATIONS:  Current Outpatient Medications:  .  amLODipine (NORVASC) 2.5 MG tablet, Take 2.5 mg by mouth daily., Disp: , Rfl:  .  citalopram (CELEXA) 20 MG tablet, Take 20 mg by mouth daily., Disp: , Rfl:  .  clopidogrel (PLAVIX) 75 MG tablet, Take 1 tablet (75 mg total) by mouth daily., Disp: 21 tablet, Rfl: 0 .  dicyclomine (BENTYL) 10 MG capsule, TAKE ONE CAPSULE BY MOUTH TWICE DAILY BEFORE MEAL(S), Disp: 60 capsule, Rfl: 5 .  hydrochlorothiazide (HYDRODIURIL) 25 MG tablet, Take 25 mg by mouth daily., Disp: , Rfl:  .  loratadine (CLARITIN) 10 MG tablet, Take 10  mg by mouth daily., Disp: , Rfl:  .  pravastatin (PRAVACHOL) 40 MG tablet, Take 40 mg by mouth daily., Disp: , Rfl:  .  vitamin B-12 (CYANOCOBALAMIN) 1000 MCG tablet, Take 1,000 mcg by mouth daily., Disp: , Rfl:  .  vitamin C (ASCORBIC ACID) 500 MG tablet, Take 500 mg by mouth daily., Disp: , Rfl:   PAST MEDICAL HISTORY: Past Medical History:  Diagnosis Date  . High cholesterol   . Hypertension    x 5 yrs  . Vision abnormalities     PAST SURGICAL HISTORY: Past Surgical History:  Procedure Laterality Date  . cardiac cath    . COLONOSCOPY N/A 02/19/2013   Procedure: COLONOSCOPY;  Surgeon: Rogene Houston, MD;  Location: [facility];  Service: Endoscopy;  Laterality: N/A;  145-moved to [address] Ann to notify pt  . GANGLION CYST EXCISION Left   . Thumb surgery      FAMILY HISTORY: Family History  Problem Relation Age of Onset  . Heart disease Mother   . COPD Mother   . Colon cancer Father   . Other Sister        "some form of autoimmune disease"  . Diabetes type II Sister   . COPD Sister   . Other Brother 71        Motorcycle Accident  . Other Brother 31       Heroin OD  . Colon cancer Brother   . Multiple sclerosis Cousin   . Multiple sclerosis Cousin     SOCIAL HISTORY:  Social History   Socioeconomic History  . Marital status: Married    Spouse name: Not on file  . Number of children: Not on file  . Years of education: Not on file  . Highest education level: Not on file  Occupational History  . Not on file  Social Needs  . Financial resource strain: Not on file  . Food insecurity:    Worry: Not on file    Inability: Not on file  . Transportation needs:    Medical: Not on file    Non-medical: Not on file  Tobacco Use  . Smoking status: Current Every Day Smoker    Packs/day: 1.00    Years: 25.00    Pack years: 25.00    Types: Cigarettes  . Smokeless tobacco: Former Systems developer    Quit date: 01/16/2012  . Tobacco comment: quit a couple of years after smoking 25yrs.  Substance and Sexual Activity  . Alcohol use: Yes    Comment: weekends  . Drug use: No  . Sexual activity: Not on file  Lifestyle  . Physical activity:    Days per week: Not on file    Minutes per session: Not on file  . Stress: Not on file  Relationships  . Social connections:    Talks on phone: Not on file    Gets together: Not on file    Attends religious service: Not on file    Active member of club or organization: Not on file    Attends meetings of clubs or organizations: Not on file    Relationship status: Not on file  . Intimate partner violence:    Fear of current or ex partner: Not on file    Emotionally abused: Not on file    Physically abused: Not on file    Forced sexual activity: Not on file  Other Topics Concern  . Not on file  Social History Narrative  . Not on  file     PHYSICAL EXAM  Vitals:   10/16/17 0850  BP: 138/76  Pulse: 71  Resp: 16  Weight: 175 lb (79.4 kg)  Height: 5\' 8"  (1.727 m)    Body mass index is 26.61 kg/m.   General: The patient is well-developed and  well-nourished and in no acute distress   Neurologic Exam  Mental status: The patient is alert and oriented x 3 at the time of the examination. The patient has apparent normal recent and remote memory, with an apparently normal attention span and concentration ability.   Speech is normal.  Cranial nerves: Extraocular movements are full.  Facial strength was normal.  Facial sensation was near normal with minimal numbness in the lower left lip.  Trapezius strength was normal.  The tongue is midline, and the patient has symmetric elevation of the soft palate. No obvious hearing deficits are noted.  Motor:  Muscle bulk is normal.   Tone is normal. Strength is  5 / 5 in all 4 extremities.   Sensory: Sensory testing is intact to pinprick, soft touch and vibration sensation in all 4 extremities.  Coordination: Cerebellar testing reveals good finger-nose-finger and heel-to-shin bilaterally.  Gait and station: Station is normal.   Gait is normal.  Tandem gait is normal.  Romberg is negative..   Reflexes: Deep tendon reflexes are symmetric and normal in arms and increased with legs with mild crossed adductor responses,.   Plantar responses are flexor.    DIAGNOSTIC DATA (LABS, IMAGING, TESTING) - I reviewed patient records, labs, notes, testing and imaging myself where available.  Lab Results  Component Value Date   WBC 14.3 (H) 08/20/2017   HGB 16.5 08/20/2017   HCT 49.3 08/20/2017   MCV 90.6 08/20/2017   PLT 264 08/20/2017      Component Value Date/Time   NA 137 08/20/2017 0349   K 3.8 08/20/2017 0349   CL 98 08/20/2017 0349   CO2 27 08/20/2017 0349   GLUCOSE 159 (H) 08/20/2017 0349   BUN 23 (H) 08/20/2017 0349   CREATININE 1.02 08/20/2017 0349   CALCIUM 9.5 08/20/2017 0349   PROT 6.8 05/02/2016 0938   ALBUMIN 4.4 05/02/2016 0938   AST 14 05/02/2016 0938   ALT 18 05/02/2016 0938   ALKPHOS 56 05/02/2016 0938   BILITOT 0.4 05/02/2016 0938   GFRNONAA >60 08/20/2017 0349   GFRAA  >60 08/20/2017 0349   Lab Results  Component Value Date   CHOL 189 08/20/2017   HDL 60 08/20/2017   LDLCALC 117 (H) 08/20/2017   TRIG 58 08/20/2017   CHOLHDL 3.2 08/20/2017   Lab Results  Component Value Date   HGBA1C 5.8 (H) 08/20/2017   Lab Results  Component Value Date   UVOZDGUY40 347 08/20/2017   No results found for: TSH     ASSESSMENT AND PLAN  Demyelinating disease ([place]) - Plan: CSF cell count with differential, Glucose, CSF, Protein, CSF, Oligoclonal bands, CSF + serm, Immunoglobulin G Index  Left facial numbness - Plan: CSF cell count with differential, Glucose, CSF, Protein, CSF, Oligoclonal bands, CSF + serm, Immunoglobulin G Index  Visual distortion - Plan: CSF cell count with differential, Glucose, CSF, Protein, CSF, Oligoclonal bands, CSF + serm, Immunoglobulin G Index  Abnormal finding on MRI of brain - Plan: CSF cell count with differential, Glucose, CSF, Protein, CSF, Oligoclonal bands, CSF + serm, Immunoglobulin G Index  Abnormal visual evoked potential - Plan: CSF cell count with differential, Glucose, CSF, Protein, CSF, Oligoclonal bands,  CSF + serm, Immunoglobulin G Index   1.   I was unable to successfully perform a lumbar puncture and was unable to collect cerebrospinal fluid for studies.  We will set him up to have the procedure performed under fluoroscopy and [facility] imaging.   We will check labs including oligoclonal bands and IgG index.  If the oligoclonal bands are present, this further confirms the MS and we will start a disease modifying therapy.  If negative, we will repeat the imaging studies in about 6 months to see if there has been progressive changes. 2.    He will return to see me in 6 months if the studies are negative or sooner if positive to begin a disease modifying therapy.  He should call us if he has new or worsening neurologic symptoms.   Michah Minton A. Felecia Shelling, MD, PhD, FAAN Certified in Neurology, Clinical Neurophysiology, Sleep  Medicine, Pain Medicine and Neuroimaging Director, [place] [facility] [address] [place], [place] [address] [phone]

## 2017-10-19 ENCOUNTER — Other Ambulatory Visit: Payer: Managed Care, Other (non HMO)

## 2017-10-26 ENCOUNTER — Ambulatory Visit
Admission: RE | Admit: 2017-10-26 | Discharge: 2017-10-26 | Disposition: A | Discharge status: Home / Self Care | Payer: Managed Care, Other (non HMO) | Source: Ambulatory Visit | Attending: Neurology | Admitting: Neurology

## 2017-10-26 ENCOUNTER — Telehealth: Payer: Self-pay | Admitting: Neurology

## 2017-10-26 VITALS — BP 131/64 | HR 58

## 2017-10-26 DIAGNOSIS — R9389 Abnormal findings on diagnostic imaging of other specified body structures: Secondary | ICD-10-CM

## 2017-10-26 DIAGNOSIS — H5319 Other subjective visual disturbances: Secondary | ICD-10-CM

## 2017-10-26 DIAGNOSIS — R200 Anesthesia of skin: Secondary | ICD-10-CM

## 2017-10-26 DIAGNOSIS — G379 Demyelinating disease of central nervous system, unspecified: Secondary | ICD-10-CM

## 2017-10-26 DIAGNOSIS — R94112 Abnormal visually evoked potential [VEP]: Secondary | ICD-10-CM

## 2017-10-26 NOTE — Progress Notes (Signed)
Blood obtained from pt's R hand for LP labs. Pt tolerated procedure well. Site is unremarkable.

## 2017-10-26 NOTE — Discharge Instructions (Signed)

## 2017-10-26 NOTE — Telephone Encounter (Signed)
I returned phone call from Jonelle Sidle from [facility] reporting stat CSF results on this patient.  CSF protein was 63 mg percent and mildly elevated.  There was insufficient quantity to report cell count.  Full report to follow later.

## 2017-10-29 ENCOUNTER — Other Ambulatory Visit: Payer: Managed Care, Other (non HMO)

## 2017-10-30 ENCOUNTER — Other Ambulatory Visit: Payer: Managed Care, Other (non HMO)

## 2017-11-02 ENCOUNTER — Other Ambulatory Visit: Payer: Managed Care, Other (non HMO)

## 2017-11-05 ENCOUNTER — Telehealth: Payer: Self-pay | Admitting: Neurology

## 2017-11-05 LAB — CSF CELL COUNT WITH DIFFERENTIAL
RBC COUNT CSF: 0 {cells}/uL (ref 0–10)
WBC CSF: 1 {cells}/uL (ref 0–5)

## 2017-11-05 LAB — CNS IGG SYNTHESIS RATE, CSF+BLOOD
ALBUMIN,CSF: 37.2 mg/dL (ref 8.0–42.0)
Albumin Serum: 4.8 g/dL (ref 3.5–5.2)
CNS-IgG Synthesis Rate: -1.4 mg/24 h (ref <=3.3)
IgG (Immunoglobin G), Serum: 643 mg/dL (ref 600–1640)
IgG Total CSF: 2.4 mg/dL (ref 0.8–7.7)
IgG-Index: 0.48 (ref <0.66)

## 2017-11-05 LAB — GLUCOSE, CSF: Glucose, CSF: 77 mg/dL (ref 40–80)

## 2017-11-05 LAB — OLIGOCLONAL BANDS, CSF + SERM

## 2017-11-05 LAB — PROTEIN, CSF: Total Protein, CSF: 63 mg/dL — ABNORMAL HIGH (ref 15–45)

## 2017-11-05 NOTE — Telephone Encounter (Signed)
The oligoclonal bands and the IgG index are negative.  That makes MS less likely though there is still a possibility.  We will need to recheck an MRI about 9 months after his last one.    I called but got voicemail.  I left a message that I will call him back.

## 2017-11-05 NOTE — Telephone Encounter (Signed)
I discussed the results of the spinal fluid with Ruben Allen.  There were no oligoclonal bands and the IgG index was normal.  This implies a lower likelihood that he has MS, but MS is still a possibility.  He will resume aspirin 81 mg.  He is scheduled to see me back in December.  He should call sooner if he has any new or worsening neurologic symptoms.   We will want to check another MRI sometime next year.

## 2017-11-06 ENCOUNTER — Other Ambulatory Visit (HOSPITAL_COMMUNITY)
Admission: RE | Admit: 2017-11-06 | Discharge: 2017-11-06 | Disposition: A | Discharge status: Home / Self Care | Payer: Managed Care, Other (non HMO) | Source: Other Acute Inpatient Hospital | Attending: Urology | Admitting: Urology

## 2017-11-06 ENCOUNTER — Ambulatory Visit: Payer: Managed Care, Other (non HMO) | Admitting: Urology

## 2017-11-06 DIAGNOSIS — R311 Benign essential microscopic hematuria: Secondary | ICD-10-CM | POA: Diagnosis present

## 2017-11-06 DIAGNOSIS — N5201 Erectile dysfunction due to arterial insufficiency: Secondary | ICD-10-CM

## 2017-11-06 LAB — URINALYSIS, COMPLETE (UACMP) WITH MICROSCOPIC
BACTERIA UA: NONE SEEN
Bilirubin Urine: NEGATIVE
Glucose, UA: NEGATIVE mg/dL
Ketones, ur: NEGATIVE mg/dL
LEUKOCYTES UA: NEGATIVE
Nitrite: NEGATIVE
PROTEIN: NEGATIVE mg/dL
SPECIFIC GRAVITY, URINE: 1.004 — AB (ref 1.005–1.030)
pH: 6 (ref 5.0–8.0)

## 2017-12-26 ENCOUNTER — Other Ambulatory Visit: Payer: Self-pay

## 2017-12-26 ENCOUNTER — Ambulatory Visit (INDEPENDENT_AMBULATORY_CARE_PROVIDER_SITE_OTHER): Payer: Managed Care, Other (non HMO) | Admitting: Neurology

## 2017-12-26 ENCOUNTER — Encounter: Payer: Self-pay | Admitting: Neurology

## 2017-12-26 VITALS — BP 126/70 | HR 76 | Resp 16 | Ht 68.0 in | Wt 182.0 lb

## 2017-12-26 DIAGNOSIS — G379 Demyelinating disease of central nervous system, unspecified: Secondary | ICD-10-CM | POA: Diagnosis not present

## 2017-12-26 DIAGNOSIS — R404 Transient alteration of awareness: Secondary | ICD-10-CM

## 2017-12-26 DIAGNOSIS — R200 Anesthesia of skin: Secondary | ICD-10-CM | POA: Diagnosis not present

## 2017-12-26 DIAGNOSIS — R0683 Snoring: Secondary | ICD-10-CM | POA: Diagnosis not present

## 2017-12-26 NOTE — Progress Notes (Signed)
[facility]  PATIENT: Ruben Allen DOB: 1960-04-06  REFERRING DOCTOR OR PCP: Dr. Gerarda Fraction SOURCE: Patient, notes from primary care, imaging and lab reports from recent hospitalization, MRI and CT images personally reviewed  _________________________________   HISTORICAL  CHIEF COMPLAINT:  Chief Complaint  Patient presents with  . Facial Numbness    Rm. 13.  Sts. he has occaional episode of altered awareness, confusion, very brief, with return to baseline./fim  . Abnormal MRI Brain    HISTORY OF PRESENT ILLNESS:  Update 12/26/2017: He is a 57 year old man who is experiencing occasional short episodes of altered awareness with confusion.    These lasted just a few minutes.   There was no LOC and he was able to still carry on conversation.   He has had only a few of these spells, all at work in the afternoons.   He sometimes feels sleepy at work if less busy but not on a typical day.   All spells occurred on a slow day.   He is sometimes more forgetful.  He sometimes seems to have trouble remembering the best way to drive.     He dozes off a lot, if not mentally stimulated.   He snores but no gasps or snorts.    EPWORTH SLEEPINESS SCALE  On a scale of 0 - 3 what is the chance of dozing:  Sitting and Reading:   0 Watching TV:    3 Sitting inactive in a public place:  2 Passenger in car for one hour:  0 Lying down to rest in the afternoon: 0 (never does) Sitting and talking to someone:  0 Sitting quietly after lunch:  3 In a car, stopped in traffic:  0  Total (out of 24):   8/24     He denies new neurologic symptoms other than the above.   He continues to have a numb sensation at times in the left lip/cheek.   His hands sometimes get numb when riding his motorcycles.   He has arm pain when he rests with right arm under neck.   He has DJD, worse at [facility] with bilateral moderate foraminal narrowing and mild spinal stenosis with milder DJD at other levels  with less potential fo nerve root involvement.    He also has an abnormal brain MRI has been evaluated for possible MS.   The MRI had shown an enhancing lesion in the left middle cerebellar peduncle and only a couple other nonspecific lesions in the brain.   CSF did not show any oligoclonal bands and the IgG index was normal.  Update 10/16/2017: Ruben Allen is a 57 yo man with visual disturbance in 2018 and left-sided facial numbness that began 08/05/2017.   I saw him 4 weeks ago for possible MS.    He also had an abnormal MRI including an enhancing lesion in the middle cerebral peduncle and one periventricular focus.    He recently had a VEP showing slowing along the left anterior optic pathway, c/w h/o optic neuritis.   Taken together, there is a high likelihood of  RRMS and we could consider a treatment to prevent more relapses and disability progression.     The facial numbness has almost completely resolved except for a strange paresthesia.     Before starting a long-term medication, he would like to be completely sure of the diagnosis.  We discussed doing a lumbar puncture to see if he has oligoclonal bands and elevated IgG index diagnosis further.  Alternatively, we could reimage in 6 to 9 months and see if there or progressive changes consistent with MS.  He would like to proceed with the lumbar puncture.      I attempted to do a lumbar puncture at the L3-L4 level was unable to enter the interspace on several passes very therefore, we will set him up [facility] imaging to have the procedure performed using fluoroscopic guidance.   From 09/20/2017: He was seen at the [facility] emergency room 08/12/2017 after presenting with left-sided facial numbness 08/05/2017.    On 08/04/17, he felt just a little numbness but when her woke up with severe numbness in the lips, inside mouth, tongue, chin and up to the left ear.   He had very mild blurry vision.    He also noted more tiredness.   He felt slightly  lightheaded.   He had mild gait ataxia but no clumsiness in the arms or legs.   CT scan was normal but MRI of the brain performed 08/19/2017 showed a faintly enhancing lesion in the left middle cerebellar peduncle and one periventricular focus in the parieto-occipital periventricular white matter that was also present on the 2007 MRI.   He received 3 days of IV Solu-Medrol.  While in the hospital, he also had a CT angiogram of the neck and head and an MRI of the cervical spine.  There is no evidence of demyelination but he did have significant degenerative changes with mild to moderate spinal stenosis at C6-C7 and foraminal narrowing at most of the other levels, moderately severe at C6-C7.  The CT angiograms were normal.  He was discharged after his steroids were completed.    He did not note benefit during those 3 days in the hospital and he took the next week off.   He began to improve about a week or two later.  His fatigue was still severe. He still felt wobbly.  Symptoms have improved very slowly.   Currently he only has numbness in the corner of his mouth on the left.  Additionally the inside of his mouth feels weird.   He still has a lot of fatigue.  Eleven years ago he had vertigo x 6 months and was diagnosed with vestibular disorder,    MRI showed one PV lesion.   Gait is back to baseline.   He denies clumsiness, weakness or numbness below his neck.    Vision is fine as far as acuity.  He has not noted color vision changes and has no definite history of optic neuritis, though today, color vision is reduced OS.    Bladder function is ok most days but he has some days with mild frequency.   He has no nocturia.  His fatigue started the same time as other symptoms.   He is sleeping later and feels apathetic.   No definite depression.   He has some irritability, more this year.    He is not noting cognitive issues.     He has sleep maintenance insomnia 1/2 the nights.      His sister has mixed connective  tissue disorder and has been very symptomatic.  I personally reviewed the MRIs of the brain, cervical spine and the CT angiograms.  The MRI of the brain shows a focus in the left middle cerebellar peduncle/posterior pons.  There was subtle enhancement seen on the coronal postcontrast images.   The focus also appeared on diffusion-weighted images but was not hypointense on ADC maps additionally, there  are a couple T2/FLAIR hyperintense foci in the hemispheres in the periventricular region parietal/occipital lobes (2 of the 3 were present on the previous MRI from 2007).   CT angiogram of the head and neck were normal.  MRI of the cervical spine did not show any demyelination.  There is spinal stenosis at C6-C7 and bilateral foraminal narrowing.   Standard labs and B12 were normal or near normal with elevated glucose (he was on steroids).  The MRI from 05/05/2005 was also reviewed.  The brainstem look normal.  The cerebellum was normal.  There were 2 periventricular foci in the right parietal/occipital region    REVIEW OF SYSTEMS: Constitutional: No fevers, chills, sweats, or change in appetite.  He notes fatigue Eyes: No visual changes, double vision, eye pain Ear, nose and throat: No hearing loss, ear pain, nasal congestion, sore throat Cardiovascular: No chest pain, palpitations Respiratory: No shortness of breath at rest or with exertion.   No wheezes GastrointestinaI: No nausea, vomiting, diarrhea, abdominal pain, fecal incontinence Genitourinary: No dysuria, urinary retention or frequency.  No nocturia. Musculoskeletal: No neck pain, back pain Integumentary: No rash, pruritus, skin lesions Neurological: as above Psychiatric: No depression at this time.  No anxiety Endocrine: No palpitations, diaphoresis, change in appetite, change in weigh or increased thirst Hematologic/Lymphatic: No anemia, purpura, petechiae. Allergic/Immunologic: No itchy/runny eyes, nasal congestion, recent allergic  reactions, rashes  ALLERGIES: No Known Allergies  HOME MEDICATIONS:  Current Outpatient Medications:  .  amLODipine (NORVASC) 2.5 MG tablet, Take 2.5 mg by mouth daily., Disp: , Rfl:  .  aspirin 81 MG chewable tablet, Chew 81 mg by mouth daily., Disp: , Rfl:  .  citalopram (CELEXA) 20 MG tablet, Take 20 mg by mouth daily., Disp: , Rfl:  .  dicyclomine (BENTYL) 10 MG capsule, TAKE ONE CAPSULE BY MOUTH TWICE DAILY BEFORE MEAL(S), Disp: 60 capsule, Rfl: 5 .  hydrochlorothiazide (HYDRODIURIL) 25 MG tablet, Take 25 mg by mouth daily., Disp: , Rfl:  .  loratadine (CLARITIN) 10 MG tablet, Take 10 mg by mouth daily., Disp: , Rfl:  .  pravastatin (PRAVACHOL) 40 MG tablet, Take 40 mg by mouth daily., Disp: , Rfl:  .  vitamin B-12 (CYANOCOBALAMIN) 1000 MCG tablet, Take 1,000 mcg by mouth daily., Disp: , Rfl:  .  vitamin C (ASCORBIC ACID) 500 MG tablet, Take 500 mg by mouth daily., Disp: , Rfl:   PAST MEDICAL HISTORY: Past Medical History:  Diagnosis Date  . High cholesterol   . Hypertension    x 5 yrs  . Vision abnormalities     PAST SURGICAL HISTORY: Past Surgical History:  Procedure Laterality Date  . cardiac cath    . COLONOSCOPY N/A 02/19/2013   Procedure: COLONOSCOPY;  Surgeon: Rogene Houston, MD;  Location: [facility];  Service: Endoscopy;  Laterality: N/A;  145-moved to [address] Ann to notify pt  . GANGLION CYST EXCISION Left   . Thumb surgery      FAMILY HISTORY: Family History  Problem Relation Age of Onset  . Heart disease Mother   . COPD Mother   . Colon cancer Father   . Other Sister        "some form of autoimmune disease"  . Diabetes type II Sister   . COPD Sister   . Other Brother 23       Motorcycle Accident  . Other Brother 31       Heroin OD  . Colon cancer Brother   . Multiple sclerosis  Cousin   . Multiple sclerosis Cousin     SOCIAL HISTORY:  Social History   Socioeconomic History  . Marital status: Married    Spouse name: Not on file  . Number of  children: Not on file  . Years of education: Not on file  . Highest education level: Not on file  Occupational History  . Not on file  Social Needs  . Financial resource strain: Not on file  . Food insecurity:    Worry: Not on file    Inability: Not on file  . Transportation needs:    Medical: Not on file    Non-medical: Not on file  Tobacco Use  . Smoking status: Current Every Day Smoker    Packs/day: 1.00    Years: 25.00    Pack years: 25.00    Types: Cigarettes  . Smokeless tobacco: Former Systems developer    Quit date: 01/16/2012  . Tobacco comment: quit a couple of years after smoking 48yrs.  Substance and Sexual Activity  . Alcohol use: Yes    Comment: weekends  . Drug use: No  . Sexual activity: Not on file  Lifestyle  . Physical activity:    Days per week: Not on file    Minutes per session: Not on file  . Stress: Not on file  Relationships  . Social connections:    Talks on phone: Not on file    Gets together: Not on file    Attends religious service: Not on file    Active member of club or organization: Not on file    Attends meetings of clubs or organizations: Not on file    Relationship status: Not on file  . Intimate partner violence:    Fear of current or ex partner: Not on file    Emotionally abused: Not on file    Physically abused: Not on file    Forced sexual activity: Not on file  Other Topics Concern  . Not on file  Social History Narrative  . Not on file     PHYSICAL EXAM  Vitals:   12/26/17 1015  BP: 126/70  Pulse: 76  Resp: 16  Weight: 182 lb (82.6 kg)  Height: 5\' 8"  (1.727 m)    Body mass index is 27.67 kg/m.   General: The patient is well-developed and well-nourished and in no acute distress   Neurologic Exam  Mental status: The patient is alert and oriented x 3 at the time of the examination. The patient has apparent normal recent and remote memory, with an apparently normal attention span and concentration ability.   Speech is  normal.  Cranial nerves: Extraocular movements are full.  Facial strength was normal.  Facial sensation was near normal with minimal numbness in the lower left lip.  Trapezius strength was normal.  The tongue is midline, and the patient has symmetric elevation of the soft palate. No obvious hearing deficits are noted.  Motor:  Muscle bulk is normal.   Tone is normal. Strength is  5 / 5 in all 4 extremities except 4+/5 right triceps (clearly weaker than left)  Sensory: Sensory testing is intact to pinprick, soft touch and vibration sensation in all 4 extremities.  Coordination: Cerebellar testing reveals good finger-nose-finger and heel-to-shin bilaterally.  Gait and station: Station is normal.   Gait and tandem gait are normal.  Romberg is negative. Reflexes: Deep tendon reflexes are symmetric and normal in arms and increased with legs with mild crossed adductor responses,.  DIAGNOSTIC DATA (LABS, IMAGING, TESTING) - I reviewed patient records, labs, notes, testing and imaging myself where available.  Lab Results  Component Value Date   WBC 14.3 (H) 08/20/2017   HGB 16.5 08/20/2017   HCT 49.3 08/20/2017   MCV 90.6 08/20/2017   PLT 264 08/20/2017      Component Value Date/Time   NA 137 08/20/2017 0349   K 3.8 08/20/2017 0349   CL 98 08/20/2017 0349   CO2 27 08/20/2017 0349   GLUCOSE 159 (H) 08/20/2017 0349   BUN 23 (H) 08/20/2017 0349   CREATININE 1.02 08/20/2017 0349   CALCIUM 9.5 08/20/2017 0349   PROT 6.8 05/02/2016 0938   ALBUMIN 4.8 10/26/2017 0920   AST 14 05/02/2016 0938   ALT 18 05/02/2016 0938   ALKPHOS 56 05/02/2016 0938   BILITOT 0.4 05/02/2016 0938   GFRNONAA >60 08/20/2017 0349   GFRAA >60 08/20/2017 0349   Lab Results  Component Value Date   CHOL 189 08/20/2017   HDL 60 08/20/2017   LDLCALC 117 (H) 08/20/2017   TRIG 58 08/20/2017   CHOLHDL 3.2 08/20/2017   Lab Results  Component Value Date   HGBA1C 5.8 (H) 08/20/2017   Lab Results  Component  Value Date   VITAMINB12 377 08/20/2017   No results found for: TSH     ASSESSMENT AND PLAN  No diagnosis found.   1.    He had an MRI with a focus in the left middle cerebellar peduncle.  There is also one periventricular focus that was present in 2007 also present on his recent MRI.  Lumbar puncture showed normal spinal fluid.  We discussed that there is a possibility that he has MS but the normal lumbar puncture makes it less than 50/50.  We will need to recheck an MRI again in about another 6 months.   2.    His mild episodes of reduce focus and awareness are unlikely to be seizure.  I am more concerned about the possibility of sleep apnea, especially as he snores.  He has mild sleepiness.  His wife will try to monitor his sleep more to determine if there is any OSA sign.  If his sleepiness worsens or if his wife notes any OSA we will check a sleep study.   3.   He will return to see me in 6 months .   Brycelyn Gambino A. Felecia Shelling, MD, PhD, FAAN Certified in Neurology, Clinical Neurophysiology, Sleep Medicine, Pain Medicine and Neuroimaging Director, [facility] [address] [place], [place] [address] [phone]

## 2017-12-27 ENCOUNTER — Telehealth: Payer: Self-pay | Admitting: Neurology

## 2017-12-27 NOTE — Telephone Encounter (Signed)
LVM for patient to be aware I informed him [facility] should be giving him a call in the next 2-3 business days. And if he has not heard from them by then to give them a call at [phone].

## 2017-12-27 NOTE — Telephone Encounter (Signed)
[employer] please schedule in may/june order sent to [facility]. They obtain the auth and will reach out to the pt to schedule.

## 2018-02-08 ENCOUNTER — Encounter (INDEPENDENT_AMBULATORY_CARE_PROVIDER_SITE_OTHER): Payer: Self-pay | Admitting: *Deleted

## 2018-06-17 ENCOUNTER — Ambulatory Visit
Admission: RE | Admit: 2018-06-17 | Discharge: 2018-06-17 | Disposition: A | Discharge status: Home / Self Care | Payer: Managed Care, Other (non HMO) | Source: Ambulatory Visit | Attending: Neurology | Admitting: Neurology

## 2018-06-17 ENCOUNTER — Other Ambulatory Visit: Payer: Self-pay

## 2018-06-17 ENCOUNTER — Telehealth: Payer: Self-pay | Admitting: Neurology

## 2018-06-17 DIAGNOSIS — R200 Anesthesia of skin: Secondary | ICD-10-CM

## 2018-06-17 DIAGNOSIS — G379 Demyelinating disease of central nervous system, unspecified: Secondary | ICD-10-CM

## 2018-06-17 MED ORDER — GADOBENATE DIMEGLUMINE 529 MG/ML IV SOLN
17.0000 mL | Freq: Once | INTRAVENOUS | Status: AC | PRN
  Administered 2018-06-17: 17 mL via INTRAVENOUS

## 2018-06-17 NOTE — Telephone Encounter (Signed)
Novella Rob: [id number] (exp. 06/13/18 to 12/10/18) patient is scheduled at GI for 06/17/18.

## 2018-06-24 ENCOUNTER — Telehealth: Payer: Self-pay | Admitting: Neurology

## 2018-06-24 NOTE — Telephone Encounter (Signed)
I left a message that I was calling about the MRI.  The MRI shows the same focus in the middle cerebellar peduncle and several scattered foci in the brain.  One small juxtacortical focus on the left was not present on the previous MRI.  CSF did not show any oligoclonal bands and the IgG index was normal last year.  The new focus, combined with the previous lesions remains concerning for the possibility of MS

## 2018-06-25 ENCOUNTER — Other Ambulatory Visit: Payer: Self-pay | Admitting: Neurology

## 2018-06-25 DIAGNOSIS — G379 Demyelinating disease of central nervous system, unspecified: Secondary | ICD-10-CM

## 2018-06-25 DIAGNOSIS — R200 Anesthesia of skin: Secondary | ICD-10-CM

## 2018-06-25 MED ORDER — AZITHROMYCIN 250 MG PO TABS: ORAL_TABLET | ORAL | 0 refills | Status: DC

## 2018-06-25 NOTE — Telephone Encounter (Signed)
I spoke with Ruben Allen about the MRI.  The MRI did show 1 very small new lesion in the left juxtacortical white matter.  I think the likelihood that he has MS is more than 50-50 though with only 1 very small new lesion (could possibly have been missed on the previous MRI) it is difficult for me to be 57+ percent certain.  We have 2 options. #1: We could initiate a disease modifying therapy.  If we do so I would recommend an agent with a safer side effect profile as the aggressiveness is not high. #2: Alternatively, we could do another MRI in 9 to 12 months and initiate a treatment if it shows any additional lesions.  He was more interested in the second option as he would prefer not to start a medication.  Of course, if he has any new symptoms he should contact us.  And we will reevaluate

## 2018-06-26 ENCOUNTER — Telehealth: Payer: Self-pay | Admitting: Neurology

## 2018-06-26 NOTE — Telephone Encounter (Signed)
06/26/18 - LVM to schedule 10 mo f/u w/ Dr. Felecia Shelling for April 2020 per Dr. Felecia Shelling

## 2018-06-27 ENCOUNTER — Telehealth: Payer: Self-pay | Admitting: Neurology

## 2018-06-27 NOTE — Telephone Encounter (Signed)
[employer] order sent to [facility] to schedule in March/April 2021. They will reach out to the patient to schedule.

## 2018-07-01 ENCOUNTER — Ambulatory Visit: Payer: Managed Care, Other (non HMO) | Admitting: Neurology

## 2018-11-27 ENCOUNTER — Other Ambulatory Visit: Payer: Self-pay | Admitting: *Deleted

## 2018-11-27 DIAGNOSIS — Z20822 Contact with and (suspected) exposure to covid-19: Secondary | ICD-10-CM

## 2018-11-29 LAB — NOVEL CORONAVIRUS, NAA: SARS-CoV-2, NAA: NOT DETECTED

## 2019-01-21 IMAGING — MR MR CERVICAL SPINE W/O CM
5 series · 36 of 48 positions shown · non-contrast
Comparison: MRI of the cervical spine July 05, 2010

CLINICAL DATA: Multiple sclerosis, new symptoms. LEFT facial
numbness. History of hypertension, hypercholesterolemia.

EXAM:
MRI CERVICAL SPINE WITHOUT CONTRAST
TECHNIQUE: Multiplanar, multisequence MR imaging of the cervical spine was
performed. No intravenous contrast was administered.

[Series 5: T2 · sagittal · 3.0mm · 0.69mm/px · 6 of 15 slices shown (1 of 2)]
[im 1/15]
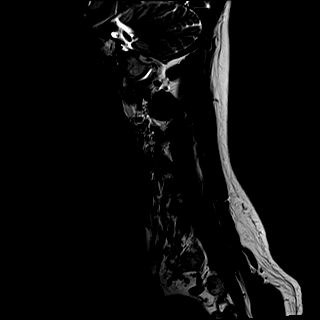
[im 3/15]
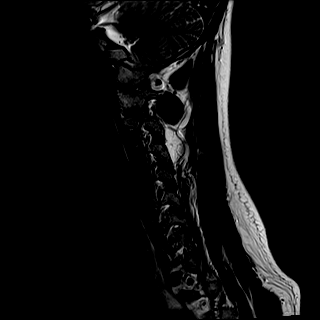
[im 6/15]
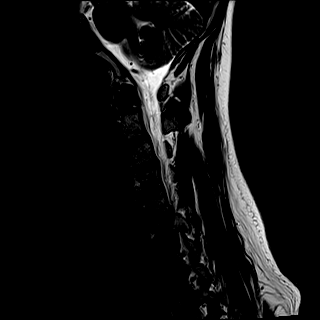
[im 9/15]
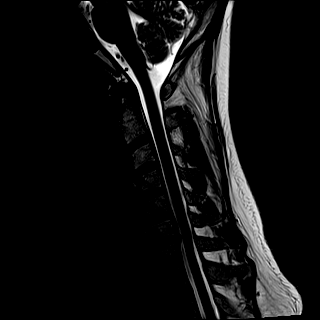
[im 12/15]
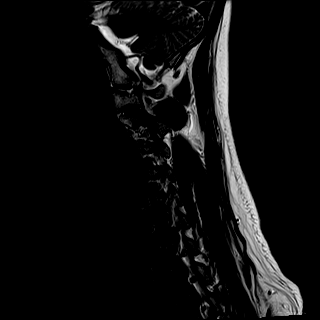
[im 15/15]
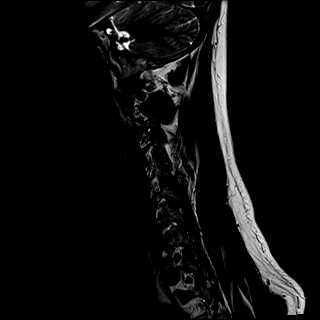

[Series 6: T1 · sagittal · 3.0mm · 0.69mm/px · 6 of 15 slices shown]
[im 1/15]
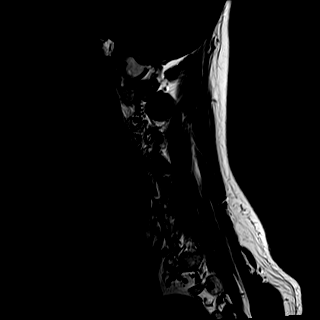
[im 3/15]
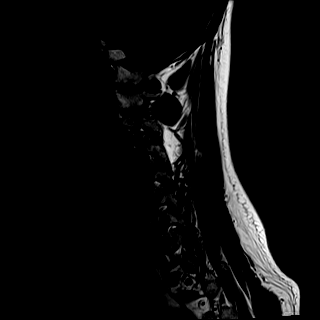
[im 6/15]
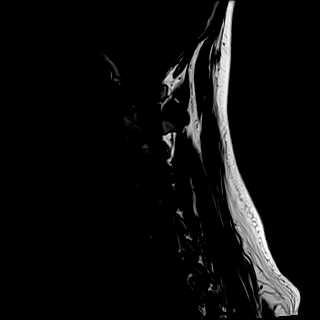
[im 9/15]
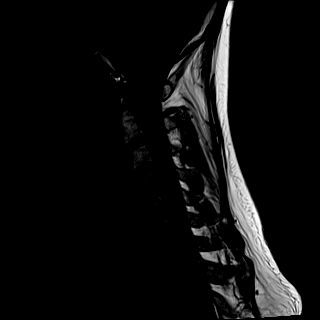
[im 12/15]
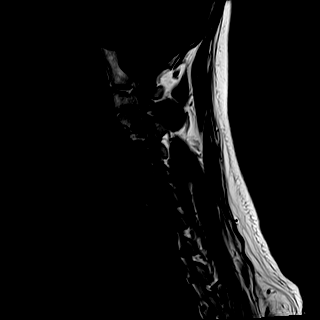
[im 15/15]
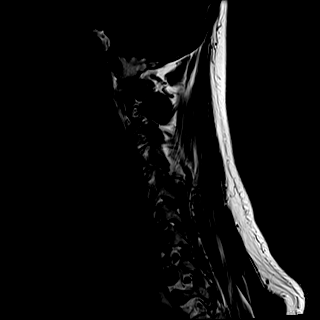

[Series 7: STIR · sagittal · 3.0mm · 0.86mm/px · 6 of 15 slices shown]
[im 1/15]
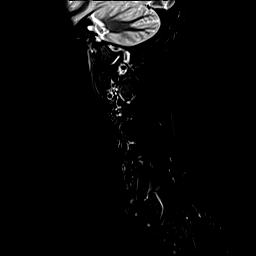
[im 3/15]
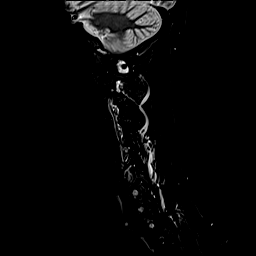
[im 6/15]
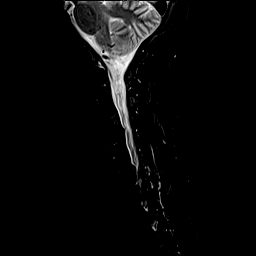
[im 9/15]
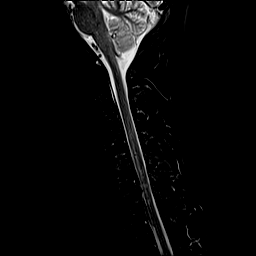
[im 12/15]
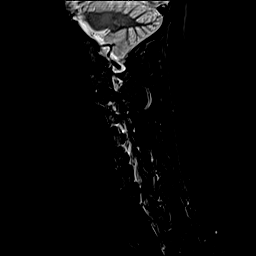
[im 15/15]
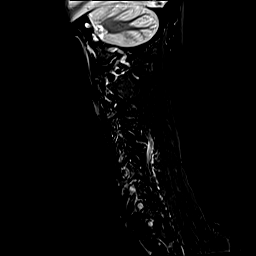

[Series 8: T2 · axial · 3.0mm · 0.66mm/px · z∈[-79,+44]mm · 10 of 40 slices shown (2 of 2)]
[im 1/40]
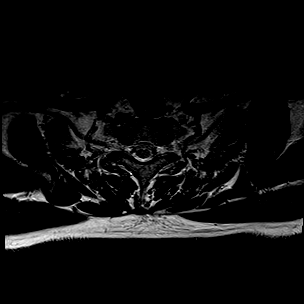
[im 3/40]
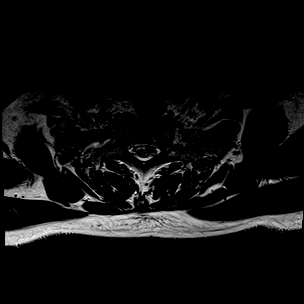
[im 6/40]
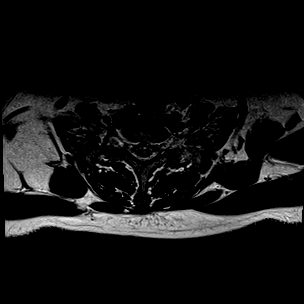
[im 12/40]
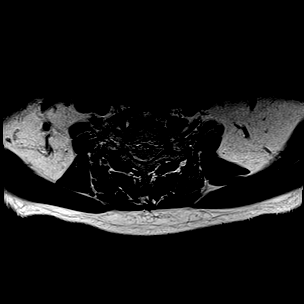
[im 17/40]
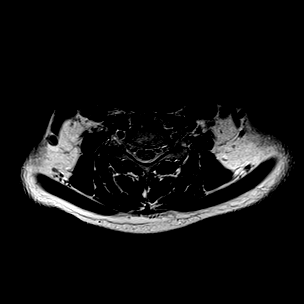
[im 20/40]
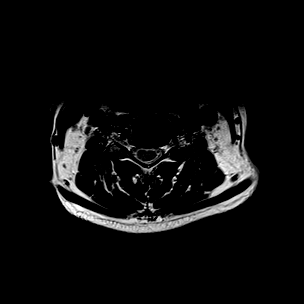
[im 23/40]
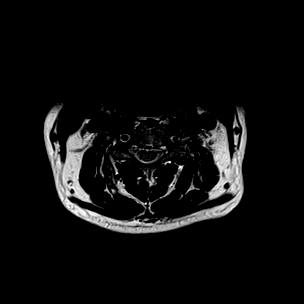
[im 28/40]
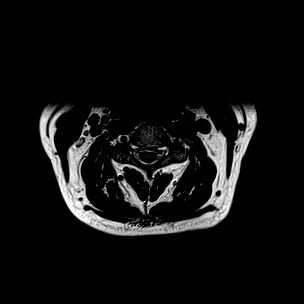
[im 34/40]
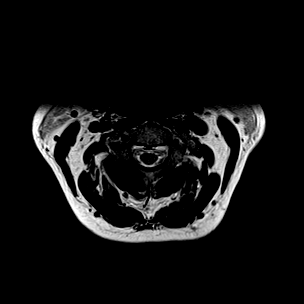
[im 40/40]
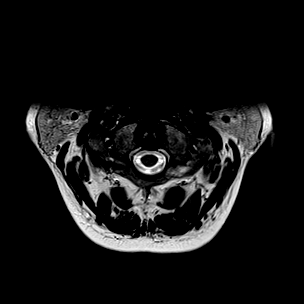

[Series 9: GRE · axial · 3.0mm · 0.39mm/px · z∈[-79,+44]mm · 8 of 40 slices shown]
[im 1/40]
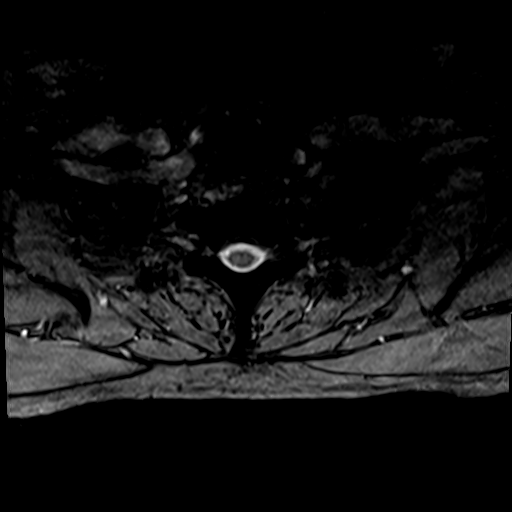
[im 6/40]
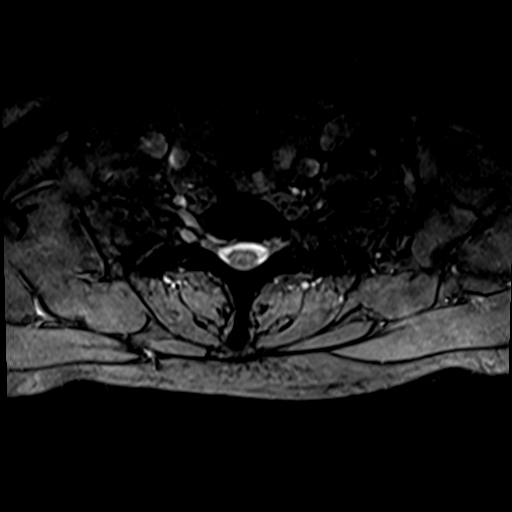
[im 12/40]
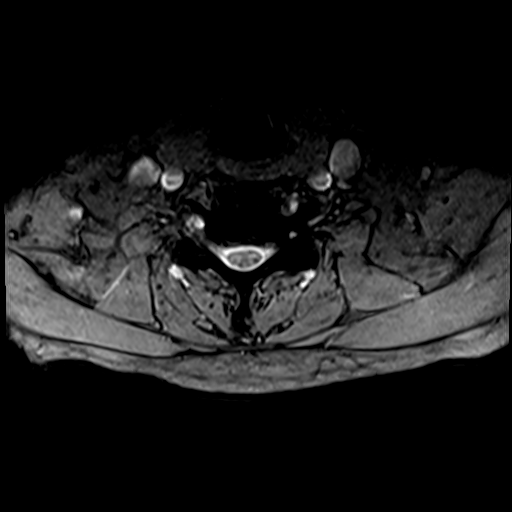
[im 17/40]
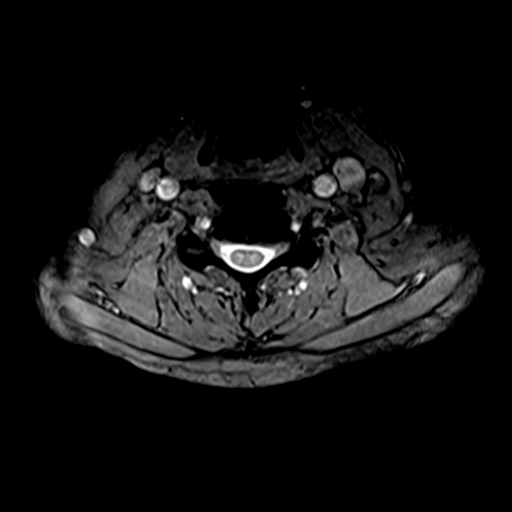
[im 23/40]
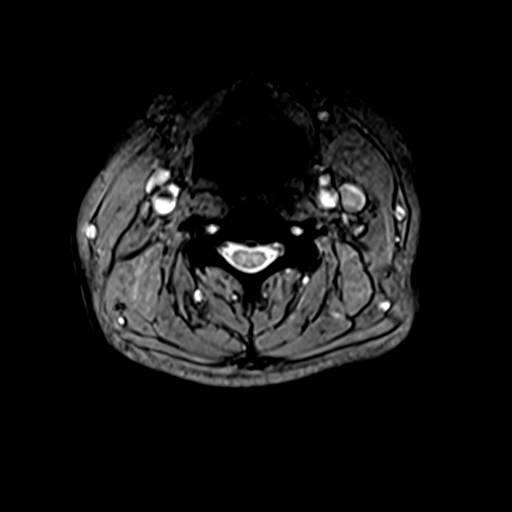
[im 28/40]
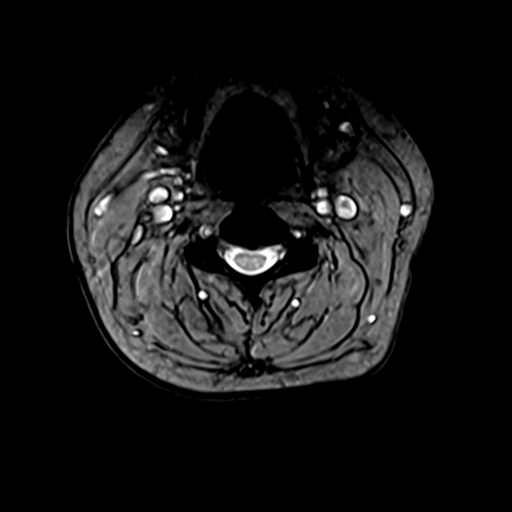
[im 34/40]
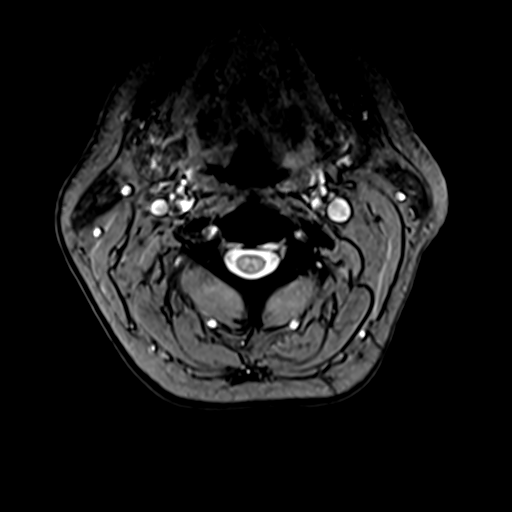
[im 40/40]
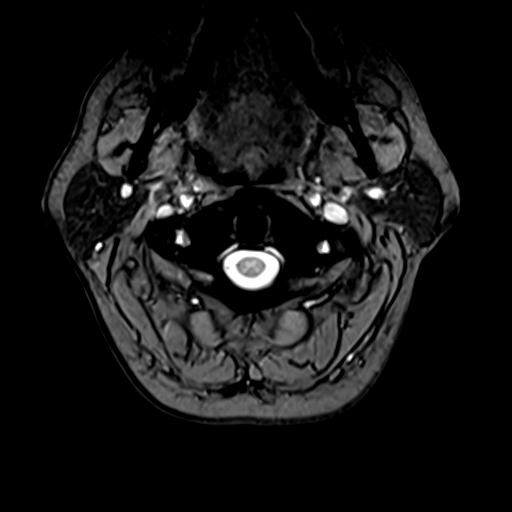

[36 of 48 positions shown; findings below may reference images not displayed]

FINDINGS: ALIGNMENT: Straightened cervical lordosis.  No malalignment.

VERTEBRAE/DISCS: Vertebral bodies are intact. Intervertebral disc
morphology's and signal are normal. Multilevel mild ventral endplate
spurring increased from prior examination. No abnormal or acute bone
marrow signal. Congenital canal narrowing on the basis of
foreshortened pedicles.

CORD:Cervical spinal cord is normal morphology and signal
characteristics from the cervicomedullary junction to level of T2-3,
the most caudal well visualized level.

POSTERIOR FOSSA, VERTEBRAL ARTERIES, PARASPINAL TISSUES: No MR
findings of ligamentous injury. Vertebral artery flow voids present.
Two subcentimeter T2 bright lesions in LEFT brachium pontis.

DISC LEVELS:

C2-3: Shallow small central disc protrusion without canal stenosis.
Mild facet arthropathy. No neural foraminal narrowing.

C3-4: Tiny central disc protrusion, uncovertebral hypertrophy and
minimal facet arthropathy without canal stenosis. Mild LEFT neural
foraminal narrowing.

C4-5: Tiny central disc protrusion. No disc bulge, canal stenosis
nor neural foraminal narrowing.

C5-6: Moderate LEFT subarticular disc protrusion and uncovertebral
hypertrophy. No canal stenosis. Mild RIGHT, moderate LEFT neural
foraminal narrowing.

C6-7: Similar 3 mm broad-based disc bulge asymmetric to the RIGHT.
Uncovertebral hypertrophy. Mild-to-moderate canal stenosis. Moderate
to severe bilateral neural foraminal narrowing.

C7-T1: No disc bulge, canal stenosis nor neural foraminal narrowing.
IMPRESSION: 1. No MR findings of demyelination in the cervical spinal cord. 2
LEFT brachium pontis lesions could represent demyelination. Please
see MRI of the brain from August 19, 2017 for dedicated findings.
2. Progressed degenerative change of the cervical spine superimposed
on congenital canal narrowing. No acute osseous process.
3. Mild-to-moderate canal stenosis C6-7.
4. Neural foraminal narrowing C3-4, C5-6 and C6-7: Moderate to
severe at C6-7.

## 2019-03-17 DIAGNOSIS — G35 Multiple sclerosis: Secondary | ICD-10-CM

## 2019-03-17 HISTORY — DX: Multiple sclerosis: G35

## 2019-03-21 ENCOUNTER — Ambulatory Visit
Admission: RE | Admit: 2019-03-21 | Discharge: 2019-03-21 | Disposition: A | Discharge status: Home / Self Care | Payer: Managed Care, Other (non HMO) | Source: Ambulatory Visit | Attending: Neurology | Admitting: Neurology

## 2019-03-21 ENCOUNTER — Telehealth: Payer: Self-pay | Admitting: Neurology

## 2019-03-21 DIAGNOSIS — R200 Anesthesia of skin: Secondary | ICD-10-CM

## 2019-03-21 DIAGNOSIS — G379 Demyelinating disease of central nervous system, unspecified: Secondary | ICD-10-CM

## 2019-03-21 MED ORDER — GADOBENATE DIMEGLUMINE 529 MG/ML IV SOLN
17.0000 mL | Freq: Once | INTRAVENOUS | Status: AC | PRN
  Administered 2019-03-21: 09:00:00 17 mL via INTRAVENOUS

## 2019-03-24 NOTE — Telephone Encounter (Signed)
I spoke with Ruben Allen about the MRI of the brain.  It showed 2 new lesions since the MRI from June 2020.  One of the foci enhanced after contrast consistent with a more subacute onset.  The combination of Ruben Allen previous symptoms and MRI changes over time meet the McDonald diagnostic criteria for MS.  Therefore, I would like to start him on a disease modifying therapy.  He will come in next week for further discussion.

## 2019-04-02 ENCOUNTER — Ambulatory Visit: Payer: Managed Care, Other (non HMO) | Admitting: Neurology

## 2019-04-02 ENCOUNTER — Other Ambulatory Visit: Payer: Self-pay

## 2019-04-02 ENCOUNTER — Encounter: Payer: Self-pay | Admitting: Neurology

## 2019-04-02 VITALS — BP 132/72 | HR 66 | Temp 97.0°F | Ht 68.0 in | Wt 172.5 lb

## 2019-04-02 DIAGNOSIS — Z79899 Other long term (current) drug therapy: Secondary | ICD-10-CM

## 2019-04-02 DIAGNOSIS — G35 Multiple sclerosis: Secondary | ICD-10-CM | POA: Insufficient documentation

## 2019-04-02 DIAGNOSIS — R200 Anesthesia of skin: Secondary | ICD-10-CM

## 2019-04-02 NOTE — Addendum Note (Signed)
Addended by: Britt Bottom on: 04/02/2019 08:48 PM   Modules accepted: Orders

## 2019-04-02 NOTE — Progress Notes (Signed)
[facility]  PATIENT: Ruben Allen DOB: Apr 30, 1960  REFERRING DOCTOR OR PCP: Dr. Gerarda Fraction SOURCE: Patient, notes from primary care, imaging and lab reports from recent hospitalization, MRI and CT images personally reviewed  _________________________________   HISTORICAL  CHIEF COMPLAINT:  Chief Complaint  Patient presents with  . Follow-up    RM 12 with Kathy (temp: 97.2). Last seen 12/26/2017. Here to f/u for MS.     HISTORY OF PRESENT ILLNESS:   Update 04/02/2019: I spoke with [name] about the MRI of the brain.  It showed 2 new lesions since the MRI from June 2020.  One of the foci enhanced after contrast consistent with a more subacute onset.  The combination of his previous symptoms and MRI changes over time meet the McDonald diagnostic criteria for MS.  Therefore, I would like to start him on a disease modifying therapy.   We had a long detailed conversation about the last MRI and I showed him the images.  I discussed different disease modifying therapies with him and his wife.  Medications discussed include [name], Vumerity/Tecfidera, Mavenclad and we also touched base on the IV therapies and injection therapies.  We went over the risks and benefits of the different treatments.  We also discussed prognosis and other aspects of MS   Update 12/26/2017: He is a 59 year old man who is experiencing occasional short episodes of altered awareness with confusion.    These lasted just a few minutes.   There was no LOC and he was able to still carry on conversation.   He has had only a few of these spells, all at work in the afternoons.   He sometimes feels sleepy at work if less busy but not on a typical day.   All spells occurred on a slow day.   He is sometimes more forgetful.  He sometimes seems to have trouble remembering the best way to drive.     He dozes off a lot, if not mentally stimulated.   He snores but no gasps or snorts.    EPWORTH  SLEEPINESS SCALE  On a scale of 0 - 3 what is the chance of dozing:  Sitting and Reading:   0 Watching TV:    3 Sitting inactive in a public place:  2 Passenger in car for one hour:  0 Lying down to rest in the afternoon: 0 (never does) Sitting and talking to someone:  0 Sitting quietly after lunch:  3 In a car, stopped in traffic:  0  Total (out of 24):   8/24     He denies new neurologic symptoms other than the above.   He continues to have a numb sensation at times in the left lip/cheek.   His hands sometimes get numb when riding his motorcycles.   He has arm pain when he rests with right arm under neck.   He has DJD, worse at [facility] with bilateral moderate foraminal narrowing and mild spinal stenosis with milder DJD at other levels with less potential fo nerve root involvement.    He also has an abnormal brain MRI has been evaluated for possible MS.   The MRI had shown an enhancing lesion in the left middle cerebellar peduncle and only a couple other nonspecific lesions in the brain.   CSF did not show any oligoclonal bands and the IgG index was normal.  Update 10/16/2017: Ruben Allen is a 59 yo man with visual disturbance in 2018 and left-sided facial numbness  that began 08/05/2017.   I saw him 4 weeks ago for possible MS.    He also had an abnormal MRI including an enhancing lesion in the middle cerebral peduncle and one periventricular focus.    He recently had a VEP showing slowing along the left anterior optic pathway, c/w h/o optic neuritis.   Taken together, there is a high likelihood of  RRMS and we could consider a treatment to prevent more relapses and disability progression.     The facial numbness has almost completely resolved except for a strange paresthesia.     Before starting a long-term medication, he would like to be completely sure of the diagnosis.  We discussed doing a lumbar puncture to see if he has oligoclonal bands and elevated IgG index diagnosis further.   Alternatively, we could reimage in 6 to 9 months and see if there or progressive changes consistent with MS.  He would like to proceed with the lumbar puncture.      I attempted to do a lumbar puncture at the L3-L4 level was unable to enter the interspace on several passes very therefore, we will set him up [facility] imaging to have the procedure performed using fluoroscopic guidance.   From 09/20/2017: He was seen at [facility] emergency room 08/12/2017 after presenting with left-sided facial numbness 08/05/2017.    On 08/04/17, he felt just a little numbness but when her woke up with severe numbness in the lips, inside mouth, tongue, chin and up to the left ear.   He had very mild blurry vision.    He also noted more tiredness.   He felt slightly lightheaded.   He had mild gait ataxia but no clumsiness in the arms or legs.   CT scan was normal but MRI of the brain performed 08/19/2017 showed a faintly enhancing lesion in the left middle cerebellar peduncle and one periventricular focus in the parieto-occipital periventricular white matter that was also present on the 2007 MRI.   He received 3 days of IV Solu-Medrol.  While in the hospital, he also had a CT angiogram of the neck and head and an MRI of the cervical spine.  There is no evidence of demyelination but he did have significant degenerative changes with mild to moderate spinal stenosis at C6-C7 and foraminal narrowing at most of the other levels, moderately severe at C6-C7.  The CT angiograms were normal.  He was discharged after his steroids were completed.    He did not note benefit during those 3 days in the hospital and he took the next week off.   He began to improve about a week or two later.  His fatigue was still severe. He still felt wobbly.  Symptoms have improved very slowly.   Currently he only has numbness in the corner of his mouth on the left.  Additionally the inside of his mouth feels weird.   He still has a lot of  fatigue.  Eleven years ago he had vertigo x 6 months and was diagnosed with vestibular disorder,    MRI showed one PV lesion.   Gait is back to baseline.   He denies clumsiness, weakness or numbness below his neck.    Vision is fine as far as acuity.  He has not noted color vision changes and has no definite history of optic neuritis, though today, color vision is reduced OS.    Bladder function is ok most days but he has some days with mild frequency.  He has no nocturia.  His fatigue started the same time as other symptoms.   He is sleeping later and feels apathetic.   No definite depression.   He has some irritability, more this year.    He is not noting cognitive issues.     He has sleep maintenance insomnia 1/2 the nights.      His sister has mixed connective tissue disorder and has been very symptomatic.  I personally reviewed the MRIs of the brain, cervical spine and the CT angiograms.  The MRI of the brain shows a focus in the left middle cerebellar peduncle/posterior pons.  There was subtle enhancement seen on the coronal postcontrast images.   The focus also appeared on diffusion-weighted images but was not hypointense on ADC maps additionally, there are a couple T2/FLAIR hyperintense foci in the hemispheres in the periventricular region parietal/occipital lobes (2 of the 3 were present on the previous MRI from 2007).   CT angiogram of the head and neck were normal.  MRI of the cervical spine did not show any demyelination.  There is spinal stenosis at C6-C7 and bilateral foraminal narrowing.   Standard labs and B12 were normal or near normal with elevated glucose (he was on steroids).  The MRI from 05/05/2005 was also reviewed.  The brainstem look normal.  The cerebellum was normal.  There were 2 periventricular foci in the right parietal/occipital region    REVIEW OF SYSTEMS: Constitutional: No fevers, chills, sweats, or change in appetite.  He notes fatigue Eyes: No visual changes,  double vision, eye pain Ear, nose and throat: No hearing loss, ear pain, nasal congestion, sore throat Cardiovascular: No chest pain, palpitations Respiratory: No shortness of breath at rest or with exertion.   No wheezes GastrointestinaI: No nausea, vomiting, diarrhea, abdominal pain, fecal incontinence Genitourinary: No dysuria, urinary retention or frequency.  No nocturia. Musculoskeletal: No neck pain, back pain Integumentary: No rash, pruritus, skin lesions Neurological: as above Psychiatric: No depression at this time.  No anxiety Endocrine: No palpitations, diaphoresis, change in appetite, change in weigh or increased thirst Hematologic/Lymphatic: No anemia, purpura, petechiae. Allergic/Immunologic: No itchy/runny eyes, nasal congestion, recent allergic reactions, rashes  ALLERGIES: No Known Allergies  HOME MEDICATIONS:  Current Outpatient Medications:  .  amLODipine (NORVASC) 2.5 MG tablet, Take 2.5 mg by mouth daily., Disp: , Rfl:  .  aspirin 81 MG chewable tablet, Chew 81 mg by mouth daily., Disp: , Rfl:  .  citalopram (CELEXA) 20 MG tablet, Take 20 mg by mouth daily., Disp: , Rfl:  .  dicyclomine (BENTYL) 10 MG capsule, TAKE ONE CAPSULE BY MOUTH TWICE DAILY BEFORE MEAL(S), Disp: 60 capsule, Rfl: 5 .  hydrochlorothiazide (HYDRODIURIL) 25 MG tablet, Take 25 mg by mouth daily., Disp: , Rfl:  .  loratadine (CLARITIN) 10 MG tablet, Take 10 mg by mouth daily., Disp: , Rfl:  .  pravastatin (PRAVACHOL) 40 MG tablet, Take 40 mg by mouth daily., Disp: , Rfl:  .  vitamin B-12 (CYANOCOBALAMIN) 1000 MCG tablet, Take 1,000 mcg by mouth daily., Disp: , Rfl:  .  vitamin C (ASCORBIC ACID) 500 MG tablet, Take 500 mg by mouth daily., Disp: , Rfl:   PAST MEDICAL HISTORY: Past Medical History:  Diagnosis Date  . High cholesterol   . Hypertension    x 5 yrs  . Vision abnormalities     PAST SURGICAL HISTORY: Past Surgical History:  Procedure Laterality Date  . cardiac cath    .  COLONOSCOPY N/A 02/19/2013  Procedure: COLONOSCOPY;  Surgeon: Rogene Houston, MD;  Location: [facility];  Service: Endoscopy;  Laterality: N/A;  145-moved to 320 Ann to notify pt  . GANGLION CYST EXCISION Left   . Thumb surgery      FAMILY HISTORY: Family History  Problem Relation Age of Onset  . Heart disease Mother   . COPD Mother   . Colon cancer Father   . Other Sister        "some form of autoimmune disease"  . Diabetes type II Sister   . COPD Sister   . Other Brother 27       Motorcycle Accident  . Other Brother 31       Heroin OD  . Colon cancer Brother   . Multiple sclerosis Cousin   . Multiple sclerosis Cousin     SOCIAL HISTORY:  Social History   Socioeconomic History  . Marital status: Married    Spouse name: Not on file  . Number of children: 0  . Years of education: 9  . Highest education level: Not on file  Occupational History  . Occupation: Isometrics   Tobacco Use  . Smoking status: Current Every Day Smoker    Packs/day: 1.00    Years: 25.00    Pack years: 25.00    Types: Cigarettes  . Smokeless tobacco: Former Systems developer    Quit date: 01/16/2012  . Tobacco comment: quit a couple of years after smoking 7yrs.  Substance and Sexual Activity  . Alcohol use: Yes    Comment: weekends- few beers  . Drug use: No  . Sexual activity: Not on file  Other Topics Concern  . Not on file  Social History Narrative   Right handed    Coffee daily   Social Determinants of Health   Financial Resource Strain:   . Difficulty of Paying Living Expenses:   Food Insecurity:   . Worried About Charity fundraiser in the Last Year:   . Arboriculturist in the Last Year:   Transportation Needs:   . Film/video editor (Medical):   Marland Kitchen Lack of Transportation (Non-Medical):   Physical Activity:   . Days of Exercise per Week:   . Minutes of Exercise per Session:   Stress:   . Feeling of Stress :   Social Connections:   . Frequency of Communication with Friends  and Family:   . Frequency of Social Gatherings with Friends and Family:   . Attends Religious Services:   . Active Member of Clubs or Organizations:   . Attends Archivist Meetings:   Marland Kitchen Marital Status:   Intimate Partner Violence:   . Fear of Current or Ex-Partner:   . Emotionally Abused:   Marland Kitchen Physically Abused:   . Sexually Abused:      PHYSICAL EXAM  Vitals:   04/02/19 0801  BP: 132/72  Pulse: 66  Temp: (!) 97 F (36.1 C)  Weight: 172 lb 8 oz (78.2 kg)  Height: 5\' 8"  (1.727 m)    Body mass index is 26.23 kg/m.   General: The patient is well-developed and well-nourished and in no acute distress   Neurologic Exam  Mental status: The patient is alert and oriented x 3 at the time of the examination. The patient has apparent normal recent and remote memory, with an apparently normal attention span and concentration ability.   Speech is normal.  Cranial nerves: Extraocular movements are full.  Facial strength was normal.  Facial  sensation was near normal with minimal numbness in the lower left lip.  Trapezius strength was normal.  No obvious hearing deficits are noted.  Motor:  Muscle bulk is normal.   Tone is normal. Strength is  5 / 5 in all 4 extremities except 4+/5 in the right triceps.  Sensory: Intact to touch and vibration in limbs  Coordination: Cerebellar testing reveals good finger-nose-finger and heel-to-shin bilaterally.  Gait and station: Station is normal.   Gait and tandem gait are normal.  Romberg is negative.  Reflexes: Deep tendon reflexes are symmetric and normal in arms and increased with legs with mild crossed adductor responses,.       DIAGNOSTIC DATA (LABS, IMAGING, TESTING) - I reviewed patient records, labs, notes, testing and imaging myself where available.  Lab Results  Component Value Date   WBC 14.3 (H) 08/20/2017   HGB 16.5 08/20/2017   HCT 49.3 08/20/2017   MCV 90.6 08/20/2017   PLT 264 08/20/2017      Component Value  Date/Time   NA 137 08/20/2017 0349   K 3.8 08/20/2017 0349   CL 98 08/20/2017 0349   CO2 27 08/20/2017 0349   GLUCOSE 159 (H) 08/20/2017 0349   BUN 23 (H) 08/20/2017 0349   CREATININE 1.02 08/20/2017 0349   CALCIUM 9.5 08/20/2017 0349   PROT 6.8 05/02/2016 0938   ALBUMIN 4.8 10/26/2017 0920   AST 14 05/02/2016 0938   ALT 18 05/02/2016 0938   ALKPHOS 56 05/02/2016 0938   BILITOT 0.4 05/02/2016 0938   GFRNONAA >60 08/20/2017 0349   GFRAA >60 08/20/2017 0349   Lab Results  Component Value Date   CHOL 189 08/20/2017   HDL 60 08/20/2017   LDLCALC 117 (H) 08/20/2017   TRIG 58 08/20/2017   CHOLHDL 3.2 08/20/2017   Lab Results  Component Value Date   HGBA1C 5.8 (H) 08/20/2017   Lab Results  Component Value Date   VITAMINB12 377 08/20/2017   No results found for: TSH     ASSESSMENT AND PLAN  Multiple sclerosis ([place]) - Plan: Hepatic function panel, CBC with Differential/Platelet, QuantiFERON-TB Gold Plus, Varicella zoster antibody, IgG  Left facial numbness  High risk medication use - Plan: Hepatic function panel, CBC with Differential/Platelet, QuantiFERON-TB Gold Plus, Varicella zoster antibody, IgG   1.    With the additional lesions in the brain including 1 that enhanced, he meets the McDonald criteria and should start a disease modifying therapy.  We discussed the options.  He is most interested in [place].  We will check the appropriate blood work and he signed a service request form.  He was also interested in potentially Mavenclad if he has any breakthrough activity.   2.    Stay active and exercise as tolerated.   3.   He will return to see me in 6 months .  45-minute interaction with the majority of the time face-to-face discussing his new diagnosis of MS and treatment options.  Additional time with record review and documentation  Ashantia Amaral A. Felecia Shelling, MD, PhD, FAAN Certified in Neurology, Clinical Neurophysiology, Sleep Medicine, Pain Medicine and  Neuroimaging Director, [place] at [facility] [address] [place], [place] [address] [phone]

## 2019-04-03 ENCOUNTER — Telehealth: Payer: Self-pay | Admitting: *Deleted

## 2019-04-03 DIAGNOSIS — Z79899 Other long term (current) drug therapy: Secondary | ICD-10-CM

## 2019-04-03 DIAGNOSIS — G35 Multiple sclerosis: Secondary | ICD-10-CM

## 2019-04-03 NOTE — Telephone Encounter (Signed)
Faxed completed/signed [name] start form to [facility] at [phone]. Received fax confirmation.  Also submitted PA [name] 14mg  tab on CMM. [id number]. Waiting on determination from [facility].

## 2019-04-04 LAB — CBC WITH DIFFERENTIAL/PLATELET
Basophils Absolute: 0.1 10*3/uL (ref 0.0–0.2)
Basos: 1 %
EOS (ABSOLUTE): 0.2 10*3/uL (ref 0.0–0.4)
Eos: 2 %
Hematocrit: 46.8 % (ref 37.5–51.0)
Hemoglobin: 15.5 g/dL (ref 13.0–17.7)
Immature Grans (Abs): 0 10*3/uL (ref 0.0–0.1)
Immature Granulocytes: 0 %
Lymphocytes Absolute: 2 10*3/uL (ref 0.7–3.1)
Lymphs: 23 %
MCH: 30.7 pg (ref 26.6–33.0)
MCHC: 33.1 g/dL (ref 31.5–35.7)
MCV: 93 fL (ref 79–97)
Monocytes Absolute: 0.7 10*3/uL (ref 0.1–0.9)
Monocytes: 8 %
Neutrophils Absolute: 5.6 10*3/uL (ref 1.4–7.0)
Neutrophils: 66 %
Platelets: 234 10*3/uL (ref 150–450)
RBC: 5.05 x10E6/uL (ref 4.14–5.80)
RDW: 12.3 % (ref 11.6–15.4)
WBC: 8.6 10*3/uL (ref 3.4–10.8)

## 2019-04-04 LAB — HEPATIC FUNCTION PANEL
ALT: 14 IU/L (ref 0–44)
AST: 19 IU/L (ref 0–40)
Albumin: 5 g/dL — ABNORMAL HIGH (ref 3.8–4.9)
Alkaline Phosphatase: 68 IU/L (ref 39–117)
Bilirubin Total: 0.3 mg/dL (ref 0.0–1.2)
Bilirubin, Direct: 0.11 mg/dL (ref 0.00–0.40)
Total Protein: 6.9 g/dL (ref 6.0–8.5)

## 2019-04-04 LAB — QUANTIFERON-TB GOLD PLUS
QuantiFERON Mitogen Value: >10 IU/mL
QuantiFERON Nil Value: 0.05 IU/mL
QuantiFERON TB1 Ag Value: 0.06 IU/mL
QuantiFERON TB2 Ag Value: 0.04 IU/mL
QuantiFERON-TB Gold Plus: NEGATIVE

## 2019-04-04 LAB — VARICELLA ZOSTER ANTIBODY, IGG: Varicella zoster IgG: 397 index (ref >165)

## 2019-04-07 NOTE — Telephone Encounter (Signed)
Per Dr. Felecia Shelling, ok to wait until new insurance starts to see if we can get [name] approved via [place] instead

## 2019-04-07 NOTE — Telephone Encounter (Signed)
Pt's wife Tye [place] called stating that she received a call from [employer] stating that they will not approve the [name]. She states that starting April 1st he will no longer have [employer] but will have BCBS and she wanted to know if they should just wait till then to send in for a approval to the new insurance. Please advise.

## 2019-04-07 NOTE — Telephone Encounter (Signed)
Received denial from [place] stating pt must try/fail the following prior to [name]: dimethyl fumarate, Gilenya, Anovex, Rebif, Betaseron or Extavia, glatiramer acetate or Glatopa, Plegridy

## 2019-04-07 NOTE — Telephone Encounter (Signed)
I called and spoke with wife. Relayed per Dr. Felecia Shelling that we can wait until new insurance starts on 04/17/19 to do PA on [name]. She will upload copy of new insurance cards via [employer] once they have a copy. She will call/email once this has been completed.  She reports he is getting first covid-19 vaccine 04/10/19 via work. Advised this is ok per Dr. Felecia Shelling.

## 2019-04-08 NOTE — Telephone Encounter (Signed)
He can start the medication

## 2019-04-08 NOTE — Telephone Encounter (Signed)
Wife called stating that the program he was enrolled in sent him 15 pills of the [name] until they find out if the insurance will be covering it and she is wanting to know if he should go ahead and start taking it. Please advise.

## 2019-04-08 NOTE — Telephone Encounter (Signed)
Dr. Curtis Sites you ok if he starts [name]?

## 2019-04-09 NOTE — Telephone Encounter (Signed)
Called wife back. Relayed Dr. Garth Bigness message. They spoke with [facility] yesterday who will send 15 tabs/10 days until insurance approval on file. He received first shot of covid-19 vaccine tomorrow. He is going to wait to start [name] until Sunday/Monday. Reminded them to let me know once he starts so we can schedule his first monthly lab draw. Reminded her this will need to be done monthly x5 months. She verbalized understanding.

## 2019-04-14 NOTE — Telephone Encounter (Signed)
Pt wife called to advise pt began taking aubagio yesterday (Sunday)

## 2019-04-14 NOTE — Telephone Encounter (Signed)
Called wife back. Offered to get lab draw set up. She prefers to have him go to their local [facility] in [place], [place] [address]: [address], [place] [address]. [phone] FAX [phone]. Advised we will fax order and he will need to complete monthly x5 months. They will call to set up his appt around week of 05/12/19 for first lab draw.   They are still waiting on new insurance info. They will update [place] once they receive.

## 2019-04-14 NOTE — Addendum Note (Signed)
Addended by: Wyvonnia Lora on: 04/14/2019 11:43 AM   Modules accepted: Orders

## 2019-04-22 ENCOUNTER — Other Ambulatory Visit: Payer: Self-pay | Admitting: *Deleted

## 2019-04-22 DIAGNOSIS — G35 Multiple sclerosis: Secondary | ICD-10-CM

## 2019-04-22 MED ORDER — AUBAGIO 14 MG PO TABS: 14.0000 mg | ORAL_TABLET | Freq: Every day | ORAL | 3 refills | Status: DC

## 2019-05-14 ENCOUNTER — Other Ambulatory Visit: Payer: Self-pay | Admitting: Neurology

## 2019-05-14 DIAGNOSIS — Z79899 Other long term (current) drug therapy: Secondary | ICD-10-CM | POA: Diagnosis not present

## 2019-05-14 DIAGNOSIS — G35 Multiple sclerosis: Secondary | ICD-10-CM | POA: Diagnosis not present

## 2019-05-15 ENCOUNTER — Encounter: Payer: Self-pay | Admitting: *Deleted

## 2019-05-15 LAB — HEPATIC FUNCTION PANEL
ALT: 19 IU/L (ref 0–44)
AST: 17 IU/L (ref 0–40)
Albumin: 4.9 g/dL (ref 3.8–4.9)
Alkaline Phosphatase: 69 IU/L (ref 39–117)
Bilirubin Total: 0.3 mg/dL (ref 0.0–1.2)
Bilirubin, Direct: 0.11 mg/dL (ref 0.00–0.40)
Total Protein: 7 g/dL (ref 6.0–8.5)

## 2019-05-27 ENCOUNTER — Telehealth: Payer: Self-pay | Admitting: Neurology

## 2019-05-27 ENCOUNTER — Other Ambulatory Visit: Payer: Self-pay | Admitting: *Deleted

## 2019-05-27 DIAGNOSIS — G35 Multiple sclerosis: Secondary | ICD-10-CM

## 2019-05-27 MED ORDER — AUBAGIO 14 MG PO TABS: 14.0000 mg | ORAL_TABLET | Freq: Every day | ORAL | 3 refills | Status: DC

## 2019-05-27 NOTE — Telephone Encounter (Signed)
Called and spoke with wife, Tye [place]. States they never have received [name] from [facility]. He has been receiving 14 days supply from [facility]. They were told they are going via [facility] now. I e-scribed rx to [facility]. Recommended she f/u with them this afternoon to make sure they get everything processed ok.   She also relayed he is scheduled for his next LFT's 06/13/19. Gave her our fax number again [phone] for them to send results once back.

## 2019-05-27 NOTE — Telephone Encounter (Signed)
[facility] has called asking for a prescription for pt's Teriflunomide ([name]) 14 MG TABS.  Pt is out of medication.  Jolayne Haines is asking if this can be processed with an urgent status

## 2019-05-28 NOTE — Telephone Encounter (Signed)
Submitted PA. Waiting on determination from [place].

## 2019-05-28 NOTE — Telephone Encounter (Signed)
Received notice from [facility] ([place]-rep) that previous message that PA not needed for [name] was incorrect and PA was in fact needed. I initiated on CMM. Key: [id number]. In process of completing.

## 2019-05-29 ENCOUNTER — Encounter: Payer: Self-pay | Admitting: *Deleted

## 2019-05-29 NOTE — Telephone Encounter (Signed)
Received fax from [facility] that [place] denied. Pt must try/fail either generic dimethyl fumarate or glatiramer acetate. I sent message to [facility] to see if they can keep him on drug since he is already established via free drug. Waiting on response.

## 2019-06-13 ENCOUNTER — Other Ambulatory Visit: Payer: Self-pay | Admitting: Neurology

## 2019-06-13 DIAGNOSIS — Z79899 Other long term (current) drug therapy: Secondary | ICD-10-CM | POA: Diagnosis not present

## 2019-06-13 DIAGNOSIS — G35 Multiple sclerosis: Secondary | ICD-10-CM | POA: Diagnosis not present

## 2019-06-14 LAB — HEPATIC FUNCTION PANEL
ALT: 15 IU/L (ref 0–44)
AST: 15 IU/L (ref 0–40)
Albumin: 4.8 g/dL (ref 3.8–4.9)
Alkaline Phosphatase: 74 IU/L (ref 48–121)
Bilirubin Total: 0.2 mg/dL (ref 0.0–1.2)
Bilirubin, Direct: 0.11 mg/dL (ref 0.00–0.40)
Total Protein: 6.5 g/dL (ref 6.0–8.5)

## 2019-06-17 ENCOUNTER — Other Ambulatory Visit: Payer: Self-pay | Admitting: *Deleted

## 2019-06-17 DIAGNOSIS — G35 Multiple sclerosis: Secondary | ICD-10-CM

## 2019-06-17 NOTE — Telephone Encounter (Addendum)
Called wife back to further discuss. She reports that he has been having pain located under his rib cage on the right side. Describes it as stabbing pain when he moves. He wants to know if this could be r/t to [name] or MS? Recent LFT's looked okay. He also reports that when he rubs his nose or cheek bone under eyes, it feels like "he got rubbed with a feather". Unable to tell me how long sx last for. Wondering if imaging needs to be ordered. If so, their preferred location is [facility]. Advised I will send a message to Dr. Felecia Shelling and will call back as soon as I can. She verbalized understanding.

## 2019-06-18 ENCOUNTER — Telehealth: Payer: Self-pay | Admitting: Neurology

## 2019-06-18 NOTE — Telephone Encounter (Signed)
BCBS Auth: [id number] (exp. 06/18/19 to 12/14/19)  Patient is scheduled at [facility] long for 06/27/19.

## 2019-06-19 ENCOUNTER — Encounter: Payer: Self-pay | Admitting: *Deleted

## 2019-06-26 NOTE — Telephone Encounter (Addendum)
Wife dropped off signed consent form needed to send in appeal for [name] this am. Faxed back appeal for [name] w/ signed consent form to [employer]. Fax: [phone]. Marked urgent. Received fax confirmation and waiting on determination. I emailed Tomma Lightning ([facility] rep) at [email or web address] to let her know appeal letter was faxed in and pending review. Advised we would update her once we had determination.

## 2019-06-27 ENCOUNTER — Other Ambulatory Visit: Payer: Self-pay

## 2019-06-27 ENCOUNTER — Ambulatory Visit (HOSPITAL_COMMUNITY)
Admission: RE | Admit: 2019-06-27 | Discharge: 2019-06-27 | Disposition: A | Discharge status: Home / Self Care | Payer: BC Managed Care – PPO | Source: Ambulatory Visit | Attending: Neurology | Admitting: Neurology

## 2019-06-27 DIAGNOSIS — M5124 Other intervertebral disc displacement, thoracic region: Secondary | ICD-10-CM | POA: Diagnosis not present

## 2019-06-27 DIAGNOSIS — M4804 Spinal stenosis, thoracic region: Secondary | ICD-10-CM | POA: Diagnosis not present

## 2019-06-27 DIAGNOSIS — G35 Multiple sclerosis: Secondary | ICD-10-CM | POA: Insufficient documentation

## 2019-06-27 MED ORDER — GADOBUTROL 1 MMOL/ML IV SOLN
7.5000 mL | Freq: Once | INTRAVENOUS | Status: AC | PRN
  Administered 2019-06-27: 7.5 mL via INTRAVENOUS

## 2019-07-01 DIAGNOSIS — R197 Diarrhea, unspecified: Secondary | ICD-10-CM | POA: Diagnosis not present

## 2019-07-01 DIAGNOSIS — K589 Irritable bowel syndrome without diarrhea: Secondary | ICD-10-CM | POA: Diagnosis not present

## 2019-07-01 DIAGNOSIS — K5900 Constipation, unspecified: Secondary | ICD-10-CM | POA: Diagnosis not present

## 2019-07-01 DIAGNOSIS — Z6825 Body mass index (BMI) 25.0-25.9, adult: Secondary | ICD-10-CM | POA: Diagnosis not present

## 2019-07-01 DIAGNOSIS — E663 Overweight: Secondary | ICD-10-CM | POA: Diagnosis not present

## 2019-07-08 ENCOUNTER — Other Ambulatory Visit: Payer: Self-pay | Admitting: *Deleted

## 2019-07-08 DIAGNOSIS — G35 Multiple sclerosis: Secondary | ICD-10-CM

## 2019-07-08 MED ORDER — AUBAGIO 14 MG PO TABS: 14.0000 mg | ORAL_TABLET | Freq: Every day | ORAL | 12 refills | Status: DC

## 2019-07-08 NOTE — Telephone Encounter (Signed)
Received response back from [place] with [employer] one [facility] one that second level appeal is needed. This was faxed to [facility] appeal department at [phone]. Received fax confirmation. Waiting on determination. Also faxed rx for Auabgio 14mg  po qd #30, 12 refills to [facility] at [phone] to have in file in case second appeal denied and they can then continue him in free drug program.

## 2019-07-10 ENCOUNTER — Telehealth: Payer: Self-pay | Admitting: Neurology

## 2019-07-10 NOTE — Telephone Encounter (Signed)
I did a conference call with [place] at 8:15 AM to try to get [name] approved for Ruben Allen.  A representative from [facility] and "Dr. Raliegh Ip" were also at the conference  [facility] repeated their policy with limited formulary.  I presented issues of his case and why [name] was a good choice for him and how other pills (such as dimethyl fumarate) would be a poor choice as he has irritable bowel syndrome (on Bentyl).  We should hear the decision by tomorrow afternoon.

## 2019-07-11 NOTE — Telephone Encounter (Signed)
Bonica from [place] called to inform provider that the [name] 14mg  has been approved and can go ahead and be sent in to the pharmacy for the pt. Hazeline Junker will be faxing over the paperwork on Monday. [id number]

## 2019-07-11 NOTE — Telephone Encounter (Signed)
Is anything we need to do at this time?

## 2019-07-14 ENCOUNTER — Other Ambulatory Visit: Payer: Self-pay | Admitting: Neurology

## 2019-07-14 DIAGNOSIS — G35 Multiple sclerosis: Secondary | ICD-10-CM | POA: Diagnosis not present

## 2019-07-14 DIAGNOSIS — Z79899 Other long term (current) drug therapy: Secondary | ICD-10-CM | POA: Diagnosis not present

## 2019-07-14 NOTE — Telephone Encounter (Signed)
I have reached out to [employer] [facility] with this information to find out what else they need from [place].

## 2019-07-15 LAB — HEPATIC FUNCTION PANEL
ALT: 16 IU/L (ref 0–44)
AST: 17 IU/L (ref 0–40)
Albumin: 4.8 g/dL (ref 3.8–4.9)
Alkaline Phosphatase: 68 IU/L (ref 48–121)
Bilirubin Total: 0.3 mg/dL (ref 0.0–1.2)
Bilirubin, Direct: 0.1 mg/dL (ref 0.00–0.40)
Total Protein: 6.5 g/dL (ref 6.0–8.5)

## 2019-07-15 NOTE — Telephone Encounter (Signed)
Received appeal approval letter from [facility] for aubagio. Appeal ID: [id number]. Date of service: 07/10/2019-06/19/2020.  I have sent a copy of this approval letter to [place] 1:1.

## 2019-07-16 NOTE — Telephone Encounter (Signed)
I also emailed Angel[place] w/ [employer] [facility] to notify her about approval.

## 2019-07-24 DIAGNOSIS — Z1389 Encounter for screening for other disorder: Secondary | ICD-10-CM | POA: Diagnosis not present

## 2019-07-24 DIAGNOSIS — M6200 Separation of muscle (nontraumatic), unspecified site: Secondary | ICD-10-CM | POA: Diagnosis not present

## 2019-07-24 DIAGNOSIS — E663 Overweight: Secondary | ICD-10-CM | POA: Diagnosis not present

## 2019-07-24 DIAGNOSIS — I639 Cerebral infarction, unspecified: Secondary | ICD-10-CM | POA: Diagnosis not present

## 2019-07-24 DIAGNOSIS — I10 Essential (primary) hypertension: Secondary | ICD-10-CM | POA: Diagnosis not present

## 2019-07-24 DIAGNOSIS — E7849 Other hyperlipidemia: Secondary | ICD-10-CM | POA: Diagnosis not present

## 2019-07-24 DIAGNOSIS — Z0000 Encounter for general adult medical examination without abnormal findings: Secondary | ICD-10-CM | POA: Diagnosis not present

## 2019-07-24 DIAGNOSIS — Z6825 Body mass index (BMI) 25.0-25.9, adult: Secondary | ICD-10-CM | POA: Diagnosis not present

## 2019-07-24 DIAGNOSIS — E782 Mixed hyperlipidemia: Secondary | ICD-10-CM | POA: Diagnosis not present

## 2019-07-24 DIAGNOSIS — Z125 Encounter for screening for malignant neoplasm of prostate: Secondary | ICD-10-CM | POA: Diagnosis not present

## 2019-07-24 DIAGNOSIS — G35 Multiple sclerosis: Secondary | ICD-10-CM | POA: Diagnosis not present

## 2019-07-31 ENCOUNTER — Ambulatory Visit (INDEPENDENT_AMBULATORY_CARE_PROVIDER_SITE_OTHER): Payer: BC Managed Care – PPO | Admitting: Gastroenterology

## 2019-07-31 ENCOUNTER — Other Ambulatory Visit (INDEPENDENT_AMBULATORY_CARE_PROVIDER_SITE_OTHER): Payer: Self-pay | Admitting: *Deleted

## 2019-07-31 ENCOUNTER — Encounter (INDEPENDENT_AMBULATORY_CARE_PROVIDER_SITE_OTHER): Payer: Self-pay | Admitting: *Deleted

## 2019-07-31 ENCOUNTER — Telehealth (INDEPENDENT_AMBULATORY_CARE_PROVIDER_SITE_OTHER): Payer: Self-pay | Admitting: *Deleted

## 2019-07-31 ENCOUNTER — Other Ambulatory Visit: Payer: Self-pay

## 2019-07-31 ENCOUNTER — Encounter (INDEPENDENT_AMBULATORY_CARE_PROVIDER_SITE_OTHER): Payer: Self-pay | Admitting: Gastroenterology

## 2019-07-31 DIAGNOSIS — Z86010 Personal history of colonic polyps: Secondary | ICD-10-CM | POA: Diagnosis not present

## 2019-07-31 DIAGNOSIS — K582 Mixed irritable bowel syndrome: Secondary | ICD-10-CM

## 2019-07-31 DIAGNOSIS — K589 Irritable bowel syndrome without diarrhea: Secondary | ICD-10-CM | POA: Insufficient documentation

## 2019-07-31 MED ORDER — SUTAB 1479-225-188 MG PO TABS: 1.0000 | ORAL_TABLET | Freq: Once | ORAL | 0 refills | Status: AC

## 2019-07-31 NOTE — Patient Instructions (Addendum)
Schedule colonoscopy with random colon biopsies Continue Bentyl 10 mg q8h as needed for abdominal pain Start Benefiber fiber supplements daily to increase stool bulk, stop if constipated Return to clinic in 3 months

## 2019-07-31 NOTE — Progress Notes (Signed)
Ruben Allen, M.D. Gastroenterology & Hepatology [facility] [address] [place], [place] [address] Primary Care Physician: Redmond School, MD [address] [place] [address]  Referring MD: Redmond School, MD  I will communicate my assessment and recommendations to the referring MD via EMR. Note: Occasional unusual wording and randomly placed punctuation marks may result from the use of speech recognition technology to transcribe this document"  Chief Complaint: Change in bowel movements  History of Present Illness: Ruben Allen is a 59 y.o. male with PMH MS, anxiety, hypertension, hyperlipidemia who presents for evaluation of change in bowel movements.  Patient states that he has had a longstanding history of intermittent change in his bowel movements between constipation and diarrhea.  He reports that his symptoms "flared" 4 to 5 months ago characterized by few days of watery bowel movements 4 to 5/day with urgency and hesitancy.  He describes the stools as watery in nature but he also reports that sometimes he has episodes of hard stool that is formed.  He denies having any episodes of diarrhea while sleeping or having any fecal soiling.  The patient states that he has also presented intermittent episodes of cramping abdominal pain in his lower abdomen that radiates to his groin.  He has been evaluated for this in the past and has been taking dicyclomine twice a day for years, even if he does not have the pain.  Otherwise, he reports that he has had 12 pound loss in the last year which has been unintentional.  The patient denies having any nausea, vomiting, fever, chills, hematochezia, melena, hematemesis, abdominal distention,jaundice, pruritus.  Patient currently on dicyclomine 10 mg capsule, citalopram 20 mg qday, Norvasc.  He is currently taking teriflunomide tablets for management of his multiple sclerosis.  Blood  work from 07/14/2019 showed hepatic function panel within normal limits.  Most recent CBC was from 04/02/2019 which showed white blood cell count of 8.6, hemoglobin 15.5 and platelets 234.  Last colonoscopy: 02/19/2013 Small polyps ablated via cold biopsy from mid sigmoid colon. Few small diverticula at sigmoid colon. Normal rectal mucosa. Small hemorrhoids below the dentate line. Polyp is hyperplastic. Next colonoscopy was recommended in 5 years because of family history of CRC.  Last EGD: Never  FHx: neg for any gastrointestinal/liver disease, brother with colon cancer in his 61s, father with colon cancer at age 52 Social: States he smokes 1 pack of cigarettes every day, denies significant alcohol or illicit drug use  Past Medical History: Past Medical History:  Diagnosis Date  . High cholesterol   . Hypertension    x 5 yrs  . Vision abnormalities     Past Surgical History: Past Surgical History:  Procedure Laterality Date  . cardiac cath    . COLONOSCOPY N/A 02/19/2013   Procedure: COLONOSCOPY;  Surgeon: Rogene Houston, MD;  Location: [facility];  Service: Endoscopy;  Laterality: N/A;  145-moved to 320 Ann to notify pt  . GANGLION CYST EXCISION Left   . Thumb surgery      Family History: Family History  Problem Relation Age of Onset  . Heart disease Mother   . COPD Mother   . Colon cancer Father   . Other Sister        "some form of autoimmune disease"  . Diabetes type II Sister   . COPD Sister   . Other Brother 37       Motorcycle Accident  . Other Brother 7  Heroin OD  . Colon cancer Brother   . Multiple sclerosis Cousin   . Multiple sclerosis Cousin     Social History: Social History   Tobacco Use  Smoking Status Current Every Day Smoker  . Packs/day: 1.00  . Years: 25.00  . Pack years: 25.00  . Types: Cigarettes  Smokeless Tobacco Former Systems developer  . Quit date: 01/16/2012  Tobacco Comment   quit a couple of years after smoking 25yrs.   Social  History   Substance and Sexual Activity  Alcohol Use Yes   Comment: weekends- few beers   Social History   Substance and Sexual Activity  Drug Use No    Allergies: No Known Allergies  Medications: Current Outpatient Medications  Medication Sig Dispense Refill  . amLODipine (NORVASC) 2.5 MG tablet Take 2.5 mg by mouth daily.    Marland Kitchen aspirin 81 MG chewable tablet Chew 81 mg by mouth daily.    . citalopram (CELEXA) 20 MG tablet Take 20 mg by mouth daily.    Marland Kitchen dicyclomine (BENTYL) 10 MG capsule TAKE ONE CAPSULE BY MOUTH TWICE DAILY BEFORE MEAL(S) 60 capsule 5  . hydrochlorothiazide (HYDRODIURIL) 25 MG tablet Take 25 mg by mouth daily.    Marland Kitchen loratadine (CLARITIN) 10 MG tablet Take 10 mg by mouth daily.    . pravastatin (PRAVACHOL) 40 MG tablet Take 40 mg by mouth daily.    . Teriflunomide ([name]) 14 MG TABS Take 14 mg by mouth daily. 30 tablet 12  . vitamin B-12 (CYANOCOBALAMIN) 1000 MCG tablet Take 1,000 mcg by mouth daily.    . vitamin C (ASCORBIC ACID) 500 MG tablet Take 500 mg by mouth daily.     No current facility-administered medications for this visit.    Review of Systems: GENERAL: negative for malaise, significant weight loss, night sweats and fever HEENT: No changes in hearing or vision, no nose bleeds or other nasal problems. No trouble swallowing NECK: Negative for lumps, goiter, pain and significant neck swelling RESPIRATORY: Negative for cough, wheezing and shortness of breath CARDIOVASCULAR: Negative for chest pain, leg swelling, palpitations, orthopnea GI: SEE HPI MUSCULOSKELETAL: Negative for joint pain or swelling, back pain, and muscle pain. SKIN: Negative for lesions, rash, and itching. PSYCH: Negative for sleep disturbance, mood disorder and recent psychosocial stressors. HEMATOLOGY Negative for prolonged bleeding, bruising easily, and swollen nodes. ENDOCRINE: Negative for cold or heat intolerance, polyuria, polydipsia and goiter. NEURO: negative for  lightheadedness, dizziness, tremor, gait imbalance, syncope and seizures. The remainder of the review of systems is noncontributory.   Physical Exam: BP (!) 144/76 (BP Location: Right Arm, Cuff Size: Normal)   Pulse 69   Temp 98 F (36.7 C) (Oral)   Ht 5\' 8"  (1.727 m)   Wt 167 lb (75.8 kg)   BMI 25.39 kg/m  GENERAL: The patient is AO x3, in no acute distress. HEENT: Head is normocephalic and atraumatic. EOMI are intact. Mouth is well hydrated and without lesions. NECK: Supple. No masses LUNGS: Clear to auscultation. No presence of rhonchi/wheezing/rales. Adequate chest expansion HEART: RRR, normal s1 and s2. ABDOMEN: mildly tender to palpation in both flanks, no guarding, no peritoneal signs, and nondistended. BS +. No masses. EXTREMITIES: Without any cyanosis, clubbing, rash, lesions or edema. NEUROLOGIC: AOx3, no focal motor deficit. SKIN: no jaundice, no rashes  Imaging/Labs: as above  I personally reviewed and interpreted the available labs, imaging and endoscopic files.  Impression and Plan: DILLEN BELMONTES is a 59 y.o. male with PMH MS, anxiety, hypertension,  hyperlipidemia who presents for evaluation of change in bowel movements.  The patient has had longstanding history of alternating bowel movement consistency which has worsened recently.  Per discussion with the patient, the time when his symptoms worsened could overlap with time he started taking his medication for multiple sclerosis, which has been described to cause diarrhea in 13 to 14% of patients.  He requires to take medication, for which he may benefit from starting Benefiber to increase the bulk of his stool to improve his symptoms.  Also, for management of his abdominal pain, he will need to take the Bentyl as prior but only if he has abdominal pain as this could be leading to intermittent episodes of constipation if he is taking on a daily basis.  Finally, the patient is due for surveillance colonoscopy given his  history of colon polyps, will take small bowel biopsies for evaluation of microscopic colitis at that time.  If no polyps are found, he can go back to a 10-year surveillance as his family members were diagnosed with colon cancer when they were older than 64 years.  -Schedule colonoscopy with random colon biopsies -Continue Bentyl 10 mg q8h as needed for abdominal pain -Start Benefiber fiber supplements daily to increase stool bulk, stop if constipated -Return to clinic in 3 months  All questions were answered.      Harvel Quale, MD Gastroenterology and Hepatology [facility]

## 2019-07-31 NOTE — Telephone Encounter (Signed)
Patient needs Sutab (copay card) ° °

## 2019-08-07 ENCOUNTER — Encounter: Payer: Self-pay | Admitting: Neurology

## 2019-08-07 ENCOUNTER — Other Ambulatory Visit: Payer: Self-pay

## 2019-08-07 ENCOUNTER — Ambulatory Visit: Payer: BC Managed Care – PPO | Admitting: Neurology

## 2019-08-07 VITALS — BP 146/83 | HR 72 | Ht 68.0 in | Wt 167.5 lb

## 2019-08-07 DIAGNOSIS — Z79899 Other long term (current) drug therapy: Secondary | ICD-10-CM | POA: Diagnosis not present

## 2019-08-07 DIAGNOSIS — R0683 Snoring: Secondary | ICD-10-CM | POA: Diagnosis not present

## 2019-08-07 DIAGNOSIS — G35 Multiple sclerosis: Secondary | ICD-10-CM | POA: Diagnosis not present

## 2019-08-07 DIAGNOSIS — R200 Anesthesia of skin: Secondary | ICD-10-CM

## 2019-08-07 NOTE — Progress Notes (Signed)
[facility]  PATIENT: Ruben Allen DOB: [birth year]  REFERRING DOCTOR OR PCP: Dr. Gerarda Fraction SOURCE: Patient, notes from primary care, imaging and lab reports from recent hospitalization, MRI and CT images personally reviewed  _________________________________   HISTORICAL  CHIEF COMPLAINT:  Chief Complaint  Patient presents with  . Follow-up    RM 12. Last seen 04/02/2019. Left side of face stays numb/tingly. Sx started a few months ago. He sometimes gets numb/ting in feet and hands. Still gets warm sensation on left side intermittently. Is more fatigued within the last few months.   . Multiple Sclerosis    On [name]    HISTORY OF PRESENT ILLNESS:   Update 08/07/2019: He started [facility] March 2021.   He has tolerated it fairly.  He has numbness and tingling in his left face, feet and hands.   He had this numbness prior to starting [name], then it seemed to worsen some but now has backed off again   He gets some stabbing pains at times.     He has a heat sensation in the left flank.   Strength and gait are fine.   He notes vision is mildly worse.    He notes mildly reduced color vision OD  He gets fatigue,  sometimes extreme enough that he feels he needs to lay down.  Fatigue is worse with heat.  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  He has fans at work but rarely is in A/C.   Sometimes when more fatigue, he feels disoriented and needs to stop and think more to remember what he is doing.   He would prefer not to go on another medication.    He sleeps well.   He snores and has had some witnessed OSA but has no interest in CPAP.    Mood is ok with occasional irritability but no outright depression.  He is on celexa.     MS History:   He was seen at the [facility] emergency room 08/12/2017 after presenting with left-sided facial numbness 08/05/2017.    On 08/04/17, he felt just a little numbness but when her woke up with severe  numbness in the lips, inside mouth, tongue, chin and up to the left ear.   He had very mild blurry vision.    He also noted more tiredness.   He felt slightly lightheaded.   He had mild gait ataxia but no clumsiness in the arms or legs.   CT scan was normal but MRI of the brain performed 08/19/2017 showed a faintly enhancing lesion in the left middle cerebellar peduncle and one periventricular focus in the parieto-occipital periventricular white matter that was also present on the 2007 MRI.   He received 3 days of IV Solu-Medrol.  While in the hospital, he also had a CT angiogram of the neck and head and an MRI of the cervical spine.  There is no evidence of demyelination but he did have significant degenerative changes with mild to moderate spinal stenosis at C6-C7 and foraminal narrowing at most of the other levels, moderately severe at C6-C7.  The CT angiograms were normal.  He was discharged after his steroids were completed.    He did not note benefit during those 3 days in the hospital and he took the next week off.   He began to improve about a week or two later.  His fatigue was still severe. He still felt wobbly.  Symptoms have improved very slowly.   Currently he only has numbness in the corner of his mouth on the left.  Additionally the inside of his mouth feels weird.   He still has a lot of fatigue.  Eleven years ago he had vertigo x 6 months and was diagnosed with vestibular disorder,    MRI showed one PV lesion.   DMT Medications:   [name] started March 2021.  IMAGING REVIEW MRI Brain 03/21/2019:  T2/flair hyperintense foci in the left middle cerebellar peduncle and in the juxtacortical and deep white matter of the hemispheres.  One focus in the right parietal lobe in the juxtacortical white matter was not present on the previous MRI and enhances on the current MRI.  This is consistent with an active demyelinating plaque.  An additional nonenhancing focus in the left posterior frontal lobe was  not present on the previous MRI.  MRI 06/17/2018 showed possibly one new focus compared to 2019.    MRi 08/19/2017 and 08/20/2017 of the brain, cervical spine and the CT angiograms.  The MRI of the brain shows a focus in the left middle cerebellar peduncle/posterior pons.  There was subtle enhancement seen on the coronal postcontrast images.   The focus also appeared on diffusion-weighted images but was not hypointense on ADC maps additionally, there are a couple T2/FLAIR hyperintense foci in the hemispheres in the periventricular region parietal/occipital lobes (2 of the 3 were present on the previous MRI from 2007).   CT angiogram of the head and neck were normal.  MRI of the cervical spine did not show any demyelination.  There is spinal stenosis at C6-C7 and bilateral foraminal narrowing.    T  he MRI from 05/05/2005 was also reviewed.  The brainstem look normal.  The cerebellum was normal.  There were 2 periventricular foci in the right parietal/occipital region  Labs: 08/19/2017:  Standard labs and B12 were normal or near normal with elevated glucose (he was on steroids).   REVIEW OF SYSTEMS: Constitutional: No fevers, chills, sweats, or change in appetite.  He notes fatigue Eyes: No visual changes, double vision, eye pain Ear, nose and throat: No hearing loss, ear pain, nasal congestion, sore throat Cardiovascular: No chest pain, palpitations Respiratory: No shortness of breath at rest or with exertion.   No wheezes GastrointestinaI: No nausea, vomiting, diarrhea, abdominal pain, fecal incontinence Genitourinary: No dysuria, urinary retention or frequency.  No nocturia. Musculoskeletal: No neck pain, back pain Integumentary: No rash, pruritus, skin lesions Neurological: as above Psychiatric: No depression at this time.  No anxiety Endocrine: No palpitations, diaphoresis, change in appetite, change in weigh or increased thirst Hematologic/Lymphatic: No anemia, purpura,  petechiae. Allergic/Immunologic: No itchy/runny eyes, nasal congestion, recent allergic reactions, rashes  ALLERGIES: No Known Allergies  HOME MEDICATIONS:  Current Outpatient Medications:  .  amLODipine (NORVASC) 2.5 MG tablet, Take 2.5 mg by mouth daily., Disp: , Rfl:  .  aspirin 81 MG chewable tablet, Chew 81 mg by mouth daily., Disp: , Rfl:  .  citalopram (CELEXA) 20 MG tablet, Take 20 mg by mouth daily., Disp: , Rfl:  .  dicyclomine (BENTYL) 10 MG capsule, TAKE ONE CAPSULE BY MOUTH TWICE DAILY BEFORE MEAL(S), Disp: 60 capsule, Rfl: 5 .  hydrochlorothiazide (HYDRODIURIL) 25 MG tablet, Take 25 mg by mouth daily., Disp: , Rfl:  .  loratadine (CLARITIN) 10 MG tablet, Take 10 mg by mouth daily., Disp: , Rfl:  .  pravastatin (PRAVACHOL) 40 MG tablet, Take 40 mg by mouth  daily., Disp: , Rfl:  .  Teriflunomide ([name]) 14 MG TABS, Take 14 mg by mouth daily., Disp: 30 tablet, Rfl: 12 .  vitamin B-12 (CYANOCOBALAMIN) 1000 MCG tablet, Take 1,000 mcg by mouth daily., Disp: , Rfl:  .  vitamin C (ASCORBIC ACID) 500 MG tablet, Take 500 mg by mouth daily., Disp: , Rfl:   PAST MEDICAL HISTORY: Past Medical History:  Diagnosis Date  . High cholesterol   . Hypertension    x 5 yrs  . MS (multiple sclerosis) ([place]) 03/2019  . Vision abnormalities     PAST SURGICAL HISTORY: Past Surgical History:  Procedure Laterality Date  . cardiac cath    . COLONOSCOPY N/A 02/19/2013   Procedure: COLONOSCOPY;  Surgeon: Rogene Houston, MD;  [facility];  Service: Endoscopy;  Laterality: N/A;  145-moved to 320 Ann to notify pt  . GANGLION CYST EXCISION Left   . Thumb surgery      FAMILY HISTORY: Family History  Problem Relation Age of Onset  . Heart disease Mother   . COPD Mother   . Colon cancer Father   . Other Sister        "some form of autoimmune disease"  . Diabetes type II Sister   . COPD Sister   . Other Brother 6       Motorcycle Accident  . Other Brother 31       Heroin OD   . Colon cancer Brother   . Multiple sclerosis Cousin   . Multiple sclerosis Cousin     SOCIAL HISTORY:  Social History   Socioeconomic History  . Marital status: Married    Spouse name: Not on file  . Number of children: 0  . Years of education: 9  . Highest education level: Not on file  Occupational History  . Occupation: Isometrics   Tobacco Use  . Smoking status: Current Every Day Smoker    Packs/day: 1.00    Years: 25.00    Pack years: 25.00    Types: Cigarettes  . Smokeless tobacco: Never Used  . Tobacco comment: quit a couple of years after smoking 43yrs.  Substance and Sexual Activity  . Alcohol use: Yes    Comment: weekends- few beers  . Drug use: No  . Sexual activity: Not on file  Other Topics Concern  . Not on file  Social History Narrative   Right handed    Coffee daily   Social Determinants of Health   Financial Resource Strain:   . Difficulty of Paying Living Expenses:   Food Insecurity:   . Worried About Charity fundraiser in the Last Year:   . Arboriculturist in the Last Year:   Transportation Needs:   . Film/video editor (Medical):   Marland Kitchen Lack of Transportation (Non-Medical):   Physical Activity:   . Days of Exercise per Week:   . Minutes of Exercise per Session:   Stress:   . Feeling of Stress :   Social Connections:   . Frequency of Communication with Friends and Family:   . Frequency of Social Gatherings with Friends and Family:   . Attends Religious Services:   . Active Member of Clubs or Organizations:   . Attends Archivist Meetings:   Marland Kitchen Marital Status:   Intimate Partner Violence:   . Fear of Current or Ex-Partner:   . Emotionally Abused:   Marland Kitchen Physically Abused:   . Sexually Abused:  PHYSICAL EXAM  Vitals:   08/07/19 0809  BP: (!) 146/83  Pulse: 72  Weight: 167 lb 8 oz (76 kg)  Height: 5\' 8"  (1.727 m)    Body mass index is 25.47 kg/m.   General: The patient is well-developed and well-nourished  and in no acute distress   Neurologic Exam  Mental status: The patient is alert and oriented x 3 at the time of the examination. The patient has apparent normal recent and remote memory, with an apparently normal attention span and concentration ability.   Speech is normal.  Cranial nerves: Extraocular movements are full.  Facial strength was normal.  Facial sensation was near normal with minimal numbness in the lower left lip.  Trapezius strength was normal.  No obvious hearing deficits are noted.  Motor:  Muscle bulk is normal.   Tone is normal. Strength is  5 / 5 in all 4 extremities except 4+/5 in the right triceps.  Sensory: Intact to touch and vibration in limbs  Coordination: Cerebellar testing reveals good finger-nose-finger and heel-to-shin bilaterally.  Gait and station: Station is normal.   Gait and tandem gait are normal.  Romberg is negative.  Reflexes: Deep tendon reflexes are symmetric and normal in arms and increased with legs with mild crossed adductor responses,.       DIAGNOSTIC DATA (LABS, IMAGING, TESTING) - I reviewed patient records, labs, notes, testing and imaging myself where available.  Lab Results  Component Value Date   WBC 8.6 04/02/2019   HGB 15.5 04/02/2019   HCT 46.8 04/02/2019   MCV 93 04/02/2019   PLT 234 04/02/2019      Component Value Date/Time   NA 137 08/20/2017 0349   K 3.8 08/20/2017 0349   CL 98 08/20/2017 0349   CO2 27 08/20/2017 0349   GLUCOSE 159 (H) 08/20/2017 0349   BUN 23 (H) 08/20/2017 0349   CREATININE 1.02 08/20/2017 0349   CALCIUM 9.5 08/20/2017 0349   PROT 6.5 07/14/2019 0817   ALBUMIN 4.8 07/14/2019 0817   ALBUMIN 4.8 10/26/2017 0920   AST 17 07/14/2019 0817   ALT 16 07/14/2019 0817   ALKPHOS 68 07/14/2019 0817   BILITOT 0.3 07/14/2019 0817   GFRNONAA >60 08/20/2017 0349   GFRAA >60 08/20/2017 0349   Lab Results  Component Value Date   CHOL 189 08/20/2017   HDL 60 08/20/2017   LDLCALC 117 (H) 08/20/2017    TRIG 58 08/20/2017   CHOLHDL 3.2 08/20/2017   Lab Results  Component Value Date   HGBA1C 5.8 (H) 08/20/2017   Lab Results  Component Value Date   VITAMINB12 377 08/20/2017   No results found for: TSH     ASSESSMENT AND PLAN  No diagnosis found.   1.   Continue [name]. Check labwork.    Will need MRI brain at next visit to determine if any subclinical progression and consider stronger DMT if occurring.   2.   Stay active and exercise as tolerated.   3.   He will return to see me in 6 months .   Elynn Patteson A. Felecia Shelling, MD, PhD, FAAN Certified in Neurology, Clinical Neurophysiology, Sleep Medicine, Pain Medicine and Neuroimaging Director, [facility] [address], [place], [place] [address] [phone]

## 2019-08-08 LAB — CBC WITH DIFFERENTIAL/PLATELET
Basophils Absolute: 0.1 10*3/uL (ref 0.0–0.2)
Basos: 1 %
EOS (ABSOLUTE): 0.5 10*3/uL — ABNORMAL HIGH (ref 0.0–0.4)
Eos: 6 %
Hematocrit: 43.9 % (ref 37.5–51.0)
Hemoglobin: 15 g/dL (ref 13.0–17.7)
Immature Grans (Abs): 0 10*3/uL (ref 0.0–0.1)
Immature Granulocytes: 0 %
Lymphocytes Absolute: 1.6 10*3/uL (ref 0.7–3.1)
Lymphs: 20 %
MCH: 29.6 pg (ref 26.6–33.0)
MCHC: 34.2 g/dL (ref 31.5–35.7)
MCV: 87 fL (ref 79–97)
Monocytes Absolute: 1 10*3/uL — ABNORMAL HIGH (ref 0.1–0.9)
Monocytes: 12 %
Neutrophils Absolute: 5.1 10*3/uL (ref 1.4–7.0)
Neutrophils: 61 %
Platelets: 212 10*3/uL (ref 150–450)
RBC: 5.06 x10E6/uL (ref 4.14–5.80)
RDW: 12.8 % (ref 11.6–15.4)
WBC: 8.2 10*3/uL (ref 3.4–10.8)

## 2019-08-08 LAB — HEPATIC FUNCTION PANEL
ALT: 28 IU/L (ref 0–44)
AST: 20 IU/L (ref 0–40)
Albumin: 5.1 g/dL — ABNORMAL HIGH (ref 3.8–4.9)
Alkaline Phosphatase: 71 IU/L (ref 48–121)
Bilirubin Total: 0.4 mg/dL (ref 0.0–1.2)
Bilirubin, Direct: 0.12 mg/dL (ref 0.00–0.40)
Total Protein: 7.3 g/dL (ref 6.0–8.5)

## 2019-08-26 NOTE — Patient Instructions (Signed)
Ruben Allen  08/26/2019     @PREFPERIOPPHARMACY @   Your procedure is scheduled on  08/29/2019   Report to [facility] at  [place].  Call this number if you have problems the morning of surgery:  [phone]   Remember:  Follow the diet and prep instructions given to you by the office.                      Take these medicines the morning of surgery with A SIP OF WATER  Amlodipine, celexa, teriflunomide.    Do not wear jewelry, make-up or nail polish.  Do not wear lotions, powders, or perfumes. Please wear deodorant and brush your teeth.  Do not shave 48 hours prior to surgery.  Men may shave face and neck.  Do not bring valuables to the hospital.  [facility] is not responsible for any belongings or valuables.  Contacts, dentures or bridgework may not be worn into surgery.  Leave your suitcase in the car.  After surgery it may be brought to your room.  For patients admitted to the hospital, discharge time will be determined by your treatment team.  Patients discharged the day of surgery will not be allowed to drive home.   Name and phone number of your driver:   family Special instructions:  DO NOT smoke the morning of your procedure.  Please read over the following fact sheets that you were given. Anesthesia Post-op Instructions and Care and Recovery After Surgery       Colonoscopy, Adult, Care After This sheet gives you information about how to care for yourself after your procedure. Your health care provider may also give you more specific instructions. If you have problems or questions, contact your health care provider. What can I expect after the procedure? After the procedure, it is common to have:  A small amount of blood in your stool for 24 hours after the procedure.  Some gas.  Mild cramping or bloating of your abdomen. Follow these instructions at home: Eating and drinking   Drink enough fluid to keep your urine pale yellow.  Follow  instructions from your health care provider about eating or drinking restrictions.  Resume your normal diet as instructed by your health care provider. Avoid heavy or fried foods that are hard to digest. Activity  Rest as told by your health care provider.  Avoid sitting for a long time without moving. Get up to take short walks every 1-2 hours. This is important to improve blood flow and breathing. Ask for help if you feel weak or unsteady.  Return to your normal activities as told by your health care provider. Ask your health care provider what activities are safe for you. Managing cramping and bloating   Try walking around when you have cramps or feel bloated.  Apply heat to your abdomen as told by your health care provider. Use the heat source that your health care provider recommends, such as a moist heat pack or a heating pad. ? Place a towel between your skin and the heat source. ? Leave the heat on for 20-30 minutes. ? Remove the heat if your skin turns bright red. This is especially important if you are unable to feel pain, heat, or cold. You may have a greater risk of getting burned. General instructions  For the first 24 hours after the procedure: ? Do not drive or use machinery. ? Do not  sign important documents. ? Do not drink alcohol. ? Do your regular daily activities at a slower pace than normal. ? Eat soft foods that are easy to digest.  Take over-the-counter and prescription medicines only as told by your health care provider.  Keep all follow-up visits as told by your health care provider. This is important. Contact a health care provider if:  You have blood in your stool 2-3 days after the procedure. Get help right away if you have:  More than a small spotting of blood in your stool.  Large blood clots in your stool.  Swelling of your abdomen.  Nausea or vomiting.  A fever.  Increasing pain in your abdomen that is not relieved with  medicine. Summary  After the procedure, it is common to have a small amount of blood in your stool. You may also have mild cramping and bloating of your abdomen.  For the first 24 hours after the procedure, do not drive or use machinery, sign important documents, or drink alcohol.  Get help right away if you have a lot of blood in your stool, nausea or vomiting, a fever, or increased pain in your abdomen. This information is not intended to replace advice given to you by your health care provider. Make sure you discuss any questions you have with your health care provider. Document Revised: 07/29/2018 Document Reviewed: 07/29/2018 Elsevier Patient Education  [place] After These instructions provide you with information about caring for yourself after your procedure. Your health care provider may also give you more specific instructions. Your treatment has been planned according to current medical practices, but problems sometimes occur. Call your health care provider if you have any problems or questions after your procedure. What can I expect after the procedure? After your procedure, you may:  Feel sleepy for several hours.  Feel clumsy and have poor balance for several hours.  Feel forgetful about what happened after the procedure.  Have poor judgment for several hours.  Feel nauseous or vomit.  Have a sore throat if you had a breathing tube during the procedure. Follow these instructions at home: For at least 24 hours after the procedure:      Have a responsible adult stay with you. It is important to have someone help care for you until you are awake and alert.  Rest as needed.  Do not: ? Participate in activities in which you could fall or become injured. ? Drive. ? Use heavy machinery. ? Drink alcohol. ? Take sleeping pills or medicines that cause drowsiness. ? Make important decisions or sign legal documents. ? Take care  of children on your own. Eating and drinking  Follow the diet that is recommended by your health care provider.  If you vomit, drink water, juice, or soup when you can drink without vomiting.  Make sure you have little or no nausea before eating solid foods. General instructions  Take over-the-counter and prescription medicines only as told by your health care provider.  If you have sleep apnea, surgery and certain medicines can increase your risk for breathing problems. Follow instructions from your health care provider about wearing your sleep device: ? Anytime you are sleeping, including during daytime naps. ? While taking prescription pain medicines, sleeping medicines, or medicines that make you drowsy.  If you smoke, do not smoke without supervision.  Keep all follow-up visits as told by your health care provider. This is important. Contact a health care provider  if:  You keep feeling nauseous or you keep vomiting.  You feel light-headed.  You develop a rash.  You have a fever. Get help right away if:  You have trouble breathing. Summary  For several hours after your procedure, you may feel sleepy and have poor judgment.  Have a responsible adult stay with you for at least 24 hours or until you are awake and alert. This information is not intended to replace advice given to you by your health care provider. Make sure you discuss any questions you have with your health care provider. Document Revised: 04/02/2017 Document Reviewed: 04/25/2015 Elsevier Patient Education  [place].

## 2019-08-27 ENCOUNTER — Other Ambulatory Visit: Payer: Self-pay

## 2019-08-27 ENCOUNTER — Other Ambulatory Visit (HOSPITAL_COMMUNITY)
Admission: RE | Admit: 2019-08-27 | Discharge: 2019-08-27 | Disposition: A | Discharge status: Home / Self Care | Payer: BC Managed Care – PPO | Source: Ambulatory Visit | Attending: Gastroenterology | Admitting: Gastroenterology

## 2019-08-27 ENCOUNTER — Encounter (HOSPITAL_COMMUNITY): Payer: Self-pay

## 2019-08-27 ENCOUNTER — Encounter (HOSPITAL_COMMUNITY)
Admission: RE | Admit: 2019-08-27 | Discharge: 2019-08-27 | Disposition: A | Discharge status: Home / Self Care | Payer: BC Managed Care – PPO | Source: Ambulatory Visit | Attending: Gastroenterology | Admitting: Gastroenterology

## 2019-08-27 DIAGNOSIS — R197 Diarrhea, unspecified: Secondary | ICD-10-CM | POA: Diagnosis not present

## 2019-08-27 DIAGNOSIS — Z86010 Personal history of colonic polyps: Secondary | ICD-10-CM | POA: Diagnosis not present

## 2019-08-27 DIAGNOSIS — K5730 Diverticulosis of large intestine without perforation or abscess without bleeding: Secondary | ICD-10-CM | POA: Diagnosis not present

## 2019-08-27 DIAGNOSIS — Z7982 Long term (current) use of aspirin: Secondary | ICD-10-CM | POA: Diagnosis not present

## 2019-08-27 DIAGNOSIS — Z01818 Encounter for other preprocedural examination: Secondary | ICD-10-CM | POA: Diagnosis not present

## 2019-08-27 DIAGNOSIS — Z79899 Other long term (current) drug therapy: Secondary | ICD-10-CM | POA: Diagnosis not present

## 2019-08-27 DIAGNOSIS — F419 Anxiety disorder, unspecified: Secondary | ICD-10-CM | POA: Diagnosis not present

## 2019-08-27 DIAGNOSIS — K648 Other hemorrhoids: Secondary | ICD-10-CM | POA: Diagnosis not present

## 2019-08-27 DIAGNOSIS — F17210 Nicotine dependence, cigarettes, uncomplicated: Secondary | ICD-10-CM | POA: Diagnosis not present

## 2019-08-27 DIAGNOSIS — Z8673 Personal history of transient ischemic attack (TIA), and cerebral infarction without residual deficits: Secondary | ICD-10-CM | POA: Diagnosis not present

## 2019-08-27 DIAGNOSIS — I10 Essential (primary) hypertension: Secondary | ICD-10-CM | POA: Diagnosis not present

## 2019-08-27 DIAGNOSIS — E7800 Pure hypercholesterolemia, unspecified: Secondary | ICD-10-CM | POA: Diagnosis not present

## 2019-08-27 DIAGNOSIS — E785 Hyperlipidemia, unspecified: Secondary | ICD-10-CM | POA: Diagnosis not present

## 2019-08-27 DIAGNOSIS — Z20822 Contact with and (suspected) exposure to covid-19: Secondary | ICD-10-CM | POA: Diagnosis not present

## 2019-08-27 DIAGNOSIS — G35 Multiple sclerosis: Secondary | ICD-10-CM | POA: Diagnosis not present

## 2019-08-27 DIAGNOSIS — Z800 Family history of malignant neoplasm of digestive organs: Secondary | ICD-10-CM | POA: Diagnosis not present

## 2019-08-27 LAB — BASIC METABOLIC PANEL
Anion gap: 12 (ref 5–15)
BUN: 20 mg/dL (ref 6–20)
CO2: 25 mmol/L (ref 22–32)
Calcium: 8.9 mg/dL (ref 8.9–10.3)
Chloride: 100 mmol/L (ref 98–111)
Creatinine, Ser: 1.1 mg/dL (ref 0.61–1.24)
GFR calc Af Amer: >60 mL/min (ref >60)
GFR calc non Af Amer: >60 mL/min (ref >60)
Glucose, Bld: 139 mg/dL — ABNORMAL HIGH (ref 70–99)
Potassium: 3.3 mmol/L — ABNORMAL LOW (ref 3.5–5.1)
Sodium: 137 mmol/L (ref 135–145)

## 2019-08-27 NOTE — Progress Notes (Signed)
°   08/27/19 1428  OBSTRUCTIVE SLEEP APNEA  Have you ever been diagnosed with sleep apnea through a sleep study? No  Do you snore loudly (loud enough to be heard through closed doors)?  1  Do you often feel tired, fatigued, or sleepy during the daytime (such as falling asleep during driving or talking to someone)? 0  Has anyone observed you stop breathing during your sleep? 1  Do you have, or are you being treated for high blood pressure? 1  BMI more than 35 kg/m2? 0  Age > 50 (1-yes) 1  Neck circumference greater than:Male 16 inches or larger, Male 17inches or larger? 0  Male Gender (Yes=1) 1  Obstructive Sleep Apnea Score 5  Score 5 or greater  Results sent to PCP

## 2019-08-28 LAB — SARS CORONAVIRUS 2 (TAT 6-24 HRS): SARS Coronavirus 2: NEGATIVE

## 2019-08-29 ENCOUNTER — Encounter (HOSPITAL_COMMUNITY): Payer: Self-pay | Admitting: Gastroenterology

## 2019-08-29 ENCOUNTER — Encounter (HOSPITAL_COMMUNITY)
Admission: RE | Disposition: A | Discharge status: Home / Self Care | Payer: Self-pay | Source: Ambulatory Visit | Attending: Gastroenterology

## 2019-08-29 ENCOUNTER — Ambulatory Visit (HOSPITAL_COMMUNITY)
Admission: RE | Admit: 2019-08-29 | Discharge: 2019-08-29 | Disposition: A | Discharge status: Home / Self Care | Payer: BC Managed Care – PPO | Source: Ambulatory Visit | Attending: Gastroenterology | Admitting: Gastroenterology

## 2019-08-29 ENCOUNTER — Ambulatory Visit (HOSPITAL_COMMUNITY): Payer: BC Managed Care – PPO | Admitting: Anesthesiology

## 2019-08-29 DIAGNOSIS — Z800 Family history of malignant neoplasm of digestive organs: Secondary | ICD-10-CM | POA: Insufficient documentation

## 2019-08-29 DIAGNOSIS — Z7982 Long term (current) use of aspirin: Secondary | ICD-10-CM | POA: Insufficient documentation

## 2019-08-29 DIAGNOSIS — K5730 Diverticulosis of large intestine without perforation or abscess without bleeding: Secondary | ICD-10-CM | POA: Diagnosis not present

## 2019-08-29 DIAGNOSIS — K648 Other hemorrhoids: Secondary | ICD-10-CM | POA: Insufficient documentation

## 2019-08-29 DIAGNOSIS — Z8673 Personal history of transient ischemic attack (TIA), and cerebral infarction without residual deficits: Secondary | ICD-10-CM | POA: Diagnosis not present

## 2019-08-29 DIAGNOSIS — Z20822 Contact with and (suspected) exposure to covid-19: Secondary | ICD-10-CM | POA: Diagnosis not present

## 2019-08-29 DIAGNOSIS — F419 Anxiety disorder, unspecified: Secondary | ICD-10-CM | POA: Diagnosis not present

## 2019-08-29 DIAGNOSIS — Z79899 Other long term (current) drug therapy: Secondary | ICD-10-CM | POA: Diagnosis not present

## 2019-08-29 DIAGNOSIS — Z86010 Personal history of colonic polyps: Secondary | ICD-10-CM | POA: Insufficient documentation

## 2019-08-29 DIAGNOSIS — I10 Essential (primary) hypertension: Secondary | ICD-10-CM | POA: Insufficient documentation

## 2019-08-29 DIAGNOSIS — E785 Hyperlipidemia, unspecified: Secondary | ICD-10-CM | POA: Diagnosis not present

## 2019-08-29 DIAGNOSIS — R197 Diarrhea, unspecified: Secondary | ICD-10-CM | POA: Diagnosis not present

## 2019-08-29 DIAGNOSIS — G35 Multiple sclerosis: Secondary | ICD-10-CM | POA: Diagnosis not present

## 2019-08-29 DIAGNOSIS — F17210 Nicotine dependence, cigarettes, uncomplicated: Secondary | ICD-10-CM | POA: Insufficient documentation

## 2019-08-29 DIAGNOSIS — K649 Unspecified hemorrhoids: Secondary | ICD-10-CM | POA: Diagnosis not present

## 2019-08-29 DIAGNOSIS — E7800 Pure hypercholesterolemia, unspecified: Secondary | ICD-10-CM | POA: Diagnosis not present

## 2019-08-29 HISTORY — PX: BIOPSY: SHX5522

## 2019-08-29 HISTORY — PX: COLONOSCOPY WITH PROPOFOL: SHX5780

## 2019-08-29 SURGERY — COLONOSCOPY WITH PROPOFOL
Anesthesia: General

## 2019-08-29 MED ORDER — PROPOFOL 10 MG/ML IV BOLUS
INTRAVENOUS | Status: AC
  Filled 2019-08-29: qty 120

## 2019-08-29 MED ORDER — LIDOCAINE HCL (CARDIAC) PF 50 MG/5ML IV SOSY
PREFILLED_SYRINGE | INTRAVENOUS | Status: DC | PRN
  Administered 2019-08-29: 80 mg via INTRAVENOUS

## 2019-08-29 MED ORDER — STERILE WATER FOR IRRIGATION IR SOLN
Status: DC | PRN
  Administered 2019-08-29: 1.5 mL

## 2019-08-29 MED ORDER — PROPOFOL 500 MG/50ML IV EMUL
INTRAVENOUS | Status: DC | PRN
  Administered 2019-08-29: 55 ug/kg/min via INTRAVENOUS
  Administered 2019-08-29: 75 ug/kg/min via INTRAVENOUS

## 2019-08-29 MED ORDER — PROPOFOL 10 MG/ML IV BOLUS
INTRAVENOUS | Status: DC | PRN
  Administered 2019-08-29: 100 mg via INTRAVENOUS
  Administered 2019-08-29: 10 mg via INTRAVENOUS
  Administered 2019-08-29 (×2): 20 mg via INTRAVENOUS

## 2019-08-29 MED ORDER — LACTATED RINGERS IV SOLN: INTRAVENOUS | Status: DC

## 2019-08-29 NOTE — Anesthesia Postprocedure Evaluation (Signed)
Anesthesia Post Note  Patient: Ruben Allen  Procedure(s) Performed: COLONOSCOPY WITH PROPOFOL (N/A ) BIOPSY  Patient location during evaluation: PACU Anesthesia Type: General Level of consciousness: awake and alert and patient cooperative Pain management: satisfactory to patient Vital Signs Assessment: post-procedure vital signs reviewed and stable Respiratory status: spontaneous breathing Cardiovascular status: stable Postop Assessment: no apparent nausea or vomiting Anesthetic complications: no   No complications documented.   Last Vitals:  Vitals:   08/29/19 0815 08/29/19 0828  BP: 119/85 129/80  Pulse: 72 68  Resp: 18   Temp:  36.5 C  SpO2: 95% 97%    Last Pain:  Vitals:   08/29/19 0830  TempSrc:   PainSc: 0-No pain                 Kacee Koren

## 2019-08-29 NOTE — Op Note (Signed)
[facility] Patient Name: Ruben Allen Certain Procedure Date: 08/29/2019 7:07 AM MRN: [id number] Date of Birth: 27-Dec-1960 Attending MD: [name] ,  CSN: [id number] Age: 59 Admit Type: Outpatient Procedure:                Colonoscopy Indications:              Clinically significant diarrhea of unexplained                            origin Providers:                [name], Caprice Kluver, Rosina Lowenstein, RN,                            Raphael Gibney, Technician Referring MD:             Redmond School, MD Medicines:                Monitored Anesthesia Care Complications:            No immediate complications. Estimated Blood Loss:     Estimated blood loss: none. Procedure:                Pre-Anesthesia Assessment:                           - Prior to the procedure, a History and Physical                            was performed, and patient medications, allergies                            and sensitivities were reviewed. The patient's                            tolerance of previous anesthesia was reviewed.                           - The risks and benefits of the procedure and the                            sedation options and risks were discussed with the                            patient. All questions were answered and informed                            consent was obtained.                           After obtaining informed consent, the colonoscope                            was passed under direct vision. Throughout the                            procedure, the patient's blood pressure, pulse, and  oxygen saturations were monitored continuously. The                            [id number]-[id number] ([id number]) scope was introduced through                            the anus and advanced to the the cecum, identified                            by appendiceal orifice and ileocecal valve. The                            colonoscopy was performed without  difficulty. The                            patient tolerated the procedure well. The quality                            of the bowel preparation was adequate. Scope                            withdrawal time was 14 minutes. Scope In: 7:38:25 AM Scope Out: 7:59:29 AM Scope Withdrawal Time: 0 hours 16 minutes 13 seconds  Total Procedure Duration: 0 hours 21 minutes 4 seconds  Findings:      The perianal and digital rectal examinations were normal.      A few small-mouthed diverticula were found in the sigmoid colon.       Biopsies for histology were taken with a cold forceps from througout the       right colon and left colon for evaluation of microscopic colitis.      Non-bleeding internal hemorrhoids were found during retroflexion. The       hemorrhoids were small. Impression:               - Diverticulosis in the sigmoid colon. Biopsied                            normal colon.                           - Non-bleeding internal hemorrhoids. Moderate Sedation:      Per Anesthesia Care Recommendation:           - Discharge patient to home (ambulatory).                           - Resume previous diet.                           - Repeat colonoscopy in 5 years for screening                            purposes.                           - Await pathology results. Procedure Code(s):        --- Professional ---  68864, GC, Colonoscopy, flexible; with biopsy,                            single or multiple Diagnosis Code(s):        --- Professional ---                           K64.8, Other hemorrhoids                           R19.7, Diarrhea, unspecified                           K57.30, Diverticulosis of large intestine without                            perforation or abscess without bleeding CPT copyright 2019 American Medical Association. All rights reserved. The codes documented in this report are preliminary and upon coder review may  be revised to meet  current compliance requirements. [name], MD [name],  08/29/2019 8:11:12 AM This report has been signed electronically. Number of Addenda: 0

## 2019-08-29 NOTE — Transfer of Care (Signed)
Immediate Anesthesia Transfer of Care Note  Patient: Ruben Allen  Procedure(s) Performed: COLONOSCOPY WITH PROPOFOL (N/A ) BIOPSY  Patient Location: PACU  Anesthesia Type:General  Level of Consciousness: awake, alert  and patient cooperative  Airway & Oxygen Therapy: Patient Spontanous Breathing  Post-op Assessment: Report given to RN and Post -op Vital signs reviewed and stable  Post vital signs: Reviewed and stable97.8  Last Vitals:  Vitals Value Taken Time  BP    Temp 97.8 97.6  Pulse 70 08/29/19 0805  Resp 11 08/29/19 0805  SpO2 98 % 08/29/19 0805  Vitals shown include unvalidated device data.  Last Pain:  Vitals:   08/29/19 0731  TempSrc:   PainSc: 0-No pain      Patients Stated Pain Goal: 6 (29/79/89 2119)  Complications: No complications documented.

## 2019-08-29 NOTE — H&P (Signed)
Ruben Allen is an 59 y.o. male.   Chief Complaint: Loose bowel movements and history of colon polyps, family history of colon cancer. HPI: 59 y.o. male with PMH MS, anxiety, hypertension, hyperlipidemia who presents for evaluation of  loose bowel movements, history of colon polyps and family history of colon cancer.  Patient reported that for the last 5 months he has had worried soft bowel movements up to 5 times per day with urgency and hesitancy.  He has lost 12 pounds unintentionally for the last year.  Denies any other complaint.  He does colonoscopy was performed 2/4//2015.  Was found to have a hyperplastic polyp.  He had a brother diagnosed with colon cancer at age 77 and 65 recall cancer at age 24.  Denies any complaints.  Past Medical History:  Diagnosis Date  . High cholesterol   . Hypertension    x 5 yrs  . MS (multiple sclerosis) ([place]) 03/2019  . Vision abnormalities     Past Surgical History:  Procedure Laterality Date  . cardiac cath    . COLONOSCOPY N/A 02/19/2013   Procedure: COLONOSCOPY;  Surgeon: Rogene Houston, MD;  Location: [facility];  Service: Endoscopy;  Laterality: N/A;  145-moved to 320 Ann to notify pt  . GANGLION CYST EXCISION Left   . Thumb surgery Left    trigger finger    Family History  Problem Relation Age of Onset  . Heart disease Mother   . COPD Mother   . Colon cancer Father   . Other Sister        "some form of autoimmune disease"  . Diabetes type II Sister   . COPD Sister   . Other Brother 69       Motorcycle Accident  . Other Brother 31       Heroin OD  . Colon cancer Brother   . Multiple sclerosis Cousin   . Multiple sclerosis Cousin    Social History:  reports that he has been smoking cigarettes. He has a 25.00 pack-year smoking history. He has never used smokeless tobacco. He reports current alcohol use. He reports that he does not use drugs.  Allergies: No Known Allergies  Medications Prior to Admission  Medication Sig  Dispense Refill  . amLODipine (NORVASC) 2.5 MG tablet Take 2.5 mg by mouth daily.    Marland Kitchen amoxicillin (AMOXIL) 500 MG capsule Take 500 mg by mouth 3 (three) times daily.    Marland Kitchen aspirin EC 81 MG tablet Take 81 mg by mouth daily at 8 pm. Swallow whole. (1930)    . Aspirin-Caffeine (BACK & BODY EXTRA STRENGTH) 500-32.5 MG TABS Take 2 tablets by mouth 2 (two) times daily as needed (pain).    . citalopram (CELEXA) 20 MG tablet Take 20 mg by mouth daily at 8 pm. 1930    . dicyclomine (BENTYL) 10 MG capsule TAKE ONE CAPSULE BY MOUTH TWICE DAILY BEFORE MEAL(S) (Patient taking differently: Take 10 mg by mouth in the morning and at bedtime. ) 60 capsule 5  . hydrochlorothiazide (HYDRODIURIL) 25 MG tablet Take 25 mg by mouth daily.    Marland Kitchen ibuprofen (ADVIL) 200 MG tablet Take 400 mg by mouth every 8 (eight) hours as needed (pain.).    Marland Kitchen loratadine (CLARITIN) 10 MG tablet Take 10 mg by mouth daily.    . pravastatin (PRAVACHOL) 40 MG tablet Take 40 mg by mouth daily at 8 pm. 1930    . SUTAB [phone] MG TABS Take 1 tablet by  mouth as directed. The day prior to the procedure take 12 tablets with 16 oz of water over a 15 to 20-minute period. One hour later drink an additional 16 oz of water over 30 minute period. Repeat as directed the day of procedure.    . Teriflunomide ([name]) 14 MG TABS Take 14 mg by mouth daily. (Patient taking differently: Take 14 mg by mouth daily at 6 PM. (1730)) 30 tablet 12  . vitamin B-12 (CYANOCOBALAMIN) 1000 MCG tablet Take 1,000 mcg by mouth daily.    . vitamin C (ASCORBIC ACID) 500 MG tablet Take 500 mg by mouth daily.      Results for orders placed or performed during the hospital encounter of 08/27/19 (from the past 48 hour(s))  SARS CORONAVIRUS 2 (TAT 6-24 HRS) Nasopharyngeal Nasopharyngeal Swab     Status: None   Collection Time: 08/27/19  2:21 PM   Specimen: Nasopharyngeal Swab  Result Value Ref Range   SARS Coronavirus 2 NEGATIVE NEGATIVE    Comment: (NOTE) SARS-CoV-2  target nucleic acids are NOT DETECTED.  The SARS-CoV-2 RNA is generally detectable in upper and lower respiratory specimens during the acute phase of infection. Negative results do not preclude SARS-CoV-2 infection, do not rule out co-infections with other pathogens, and should not be used as the sole basis for treatment or other patient management decisions. Negative results must be combined with clinical observations, patient history, and epidemiological information. The expected result is Negative.  Fact Sheet for Patients: SugarRoll.be  Fact Sheet for Healthcare Providers: https://www.woods-mathews.com/  This test is not yet approved or cleared by the [place] FDA and  has been authorized for detection and/or diagnosis of SARS-CoV-2 by FDA under an Emergency Use Authorization (EUA). This EUA will remain  in effect (meaning this test can be used) for the duration of the COVID-19 declaration under Se ction 564(b)(1) of the Act, 21 U.S.C. section 360bbb-3(b)(1), unless the authorization is terminated or revoked sooner.  Performed at [facility], [place] [address] [place], [place] [address]   Basic metabolic panel     Status: Abnormal   Collection Time: 08/27/19  2:43 PM  Result Value Ref Range   Sodium 137 135 - 145 mmol/L   Potassium 3.3 (L) 3.5 - 5.1 mmol/L   Chloride 100 98 - 111 mmol/L   CO2 25 22 - 32 mmol/L   Glucose, Bld 139 (H) 70 - 99 mg/dL    Comment: Glucose reference range applies only to samples taken after fasting for at least 8 hours.   BUN 20 6 - 20 mg/dL   Creatinine, Ser 1.10 0.61 - 1.24 mg/dL   Calcium 8.9 8.9 - 10.3 mg/dL   GFR calc non Af Amer >60 >60 mL/min   GFR calc Af Amer >60 >60 mL/min   Anion gap 12 5 - 15    Comment: Performed at [facility], [address], [place], [place] [address]   No results found.  Review of Systems  Constitutional: Negative.   HENT: Negative.   Eyes: Negative.    Respiratory: Negative.   Cardiovascular: Negative.   Gastrointestinal: Positive for diarrhea.  Endocrine: Negative.   Genitourinary: Negative.   Musculoskeletal: Negative.   Skin: Negative.   Allergic/Immunologic: Negative.   Neurological: Negative.   Hematological: Negative.   Psychiatric/Behavioral: Negative.     Blood pressure 127/85, pulse 73, temperature 98.2 F (36.8 C), temperature source Oral, resp. rate 18, SpO2 96 %. Physical Exam  GENERAL: The patient is AO x3,  in no acute distress. HEENT: Head is normocephalic and atraumatic. EOMI are intact. Mouth is well hydrated and without lesions. NECK: Supple. No masses LUNGS: Clear to auscultation. No presence of rhonchi/wheezing/rales. Adequate chest expansion HEART: RRR, normal s1 and s2. ABDOMEN: Soft, nontender, no guarding, no peritoneal signs, and nondistended. BS +. No masses. EXTREMITIES: Without any cyanosis, clubbing, rash, lesions or edema. NEUROLOGIC: AOx3, no focal motor deficit. SKIN: no jaundice, no rashes  Assessment/Plan 59 y.o. male with PMH MS, anxiety, hypertension, hyperlipidemia who presents for evaluation of  loose bowel movements, history of colon polyps and family history of colon cancer.  We will proceed with colonoscopy today. Harvel Quale, MD 08/29/2019, 7:32 AM

## 2019-08-29 NOTE — Anesthesia Preprocedure Evaluation (Signed)
Anesthesia Evaluation  Patient identified by MRN, date of birth, ID band Patient awake    Reviewed: Allergy & Precautions, H&P , NPO status , Patient's Chart, lab work & pertinent test results, reviewed documented beta blocker date and time   Airway Mallampati: II  TM Distance: >3 FB Neck ROM: full    Dental no notable dental hx.    Pulmonary neg pulmonary ROS, Current Smoker and Patient abstained from smoking.,    Pulmonary exam normal breath sounds clear to auscultation       Cardiovascular Exercise Tolerance: Good hypertension, negative cardio ROS   Rhythm:regular Rate:Normal     Neuro/Psych  Neuromuscular disease CVA negative psych ROS   GI/Hepatic negative GI ROS, Neg liver ROS,   Endo/Other  negative endocrine ROS  Renal/GU negative Renal ROS  negative genitourinary   Musculoskeletal   Abdominal   Peds  Hematology negative hematology ROS (+)   Anesthesia Other Findings   Reproductive/Obstetrics negative OB ROS                             Anesthesia Physical Anesthesia Plan  ASA: III  Anesthesia Plan: General   Post-op Pain Management:    Induction:   PONV Risk Score and Plan: Propofol infusion  Airway Management Planned:   Additional Equipment:   Intra-op Plan:   Post-operative Plan:   Informed Consent: I have reviewed the patients History and Physical, chart, labs and discussed the procedure including the risks, benefits and alternatives for the proposed anesthesia with the patient or authorized representative who has indicated his/her understanding and acceptance.     Dental Advisory Given  Plan Discussed with: CRNA  Anesthesia Plan Comments:         Anesthesia Quick Evaluation

## 2019-08-29 NOTE — Anesthesia Procedure Notes (Signed)
Date/Time: 08/29/2019 7:30 AM Performed by: Vista Deck, CRNA Pre-anesthesia Checklist: Patient identified, Emergency Drugs available, Suction available, Timeout performed and Patient being monitored Patient Re-evaluated:Patient Re-evaluated prior to induction Oxygen Delivery Method: Nasal Cannula

## 2019-08-29 NOTE — Discharge Instructions (Signed)
You are being discharged to home.  Resume your previous diet.  Your physician has recommended a repeat colonoscopy in five years for screening purposes.  We are waiting for your pathology results.      Colon Biopsy, Care After This sheet gives you information about how to care for yourself after your procedure. Your health care provider may also give you more specific instructions. If you have problems or questions, contact your health care provider. What can I expect after the procedure? After the procedure, it is common to have:  A small amount of blood in your stool for 24 hours after the procedure.  Some gas.  Mild cramping or bloating in your abdomen. Follow these instructions at home: General instructions  For the first 24 hours after the procedure: ? Do not drive or use machinery. ? Do not sign important documents. ? Do not drink alcohol. ? Do your regular daily activities at a slower pace than normal. ? Eat soft, easy-to-digest foods. ? Rest often.  Take over-the-counter or prescription medicines only as told by your health care provider.  Keep all follow-up visits as told by your health care provider. This is important. Relieving cramping and bloating   Try walking around when you have cramps or feel bloated.  Put heat on your abdomen as told by your health care provider. Use a heat source that your health care provider recommends, such as a moist heat pack or a heating pad. ? Place a towel between your skin and the heat source. ? Leave the heat on for 20-30 minutes. ? Remove the heat if your skin turns bright red. This is especially important if you are unable to feel pain, heat, or cold. You may have a greater risk of getting burned. Eating and drinking  Drink enough fluid to keep your urine pale yellow.  Return to your normal diet as instructed by your health care provider. Avoid heavy or fried foods that are hard to digest.  Avoid drinking alcohol for as  long as told by your health care provider. Contact a health care provider if:  You have blood in your stool 2-3 days after the procedure. Get help right away if:  You have more than a small spotting of blood in your stool.  You pass large blood clots in your stool.  Your abdomen is swollen.  You have nausea or vomiting.  You have a fever.  You have increasing abdominal pain that is not relieved with medicine. Summary  After the procedure, it is common to have mild cramping and bloating in the abdomen.  Do not drive for 24 hours after the procedure.  Try walking around when you have cramps or feel bloated. This information is not intended to replace advice given to you by your health care provider. Make sure you discuss any questions you have with your health care provider. Document Revised: 12/15/2016 Document Reviewed: 06/13/2016 Elsevier Patient Education  [place] After These instructions provide you with information about caring for yourself after your procedure. Your health care provider may also give you more specific instructions. Your treatment has been planned according to current medical practices, but problems sometimes occur. Call your health care provider if you have any problems or questions after your procedure. What can I expect after the procedure? After your procedure, you may:  Feel sleepy for several hours.  Feel clumsy and have poor balance for several hours.  Feel forgetful about what  happened after the procedure.  Have poor judgment for several hours.  Feel nauseous or vomit.  Have a sore throat if you had a breathing tube during the procedure. Follow these instructions at home: For at least 24 hours after the procedure:      Have a responsible adult stay with you. It is important to have someone help care for you until you are awake and alert.  Rest as needed.  Do not: ? Participate in  activities in which you could fall or become injured. ? Drive. ? Use heavy machinery. ? Drink alcohol. ? Take sleeping pills or medicines that cause drowsiness. ? Make important decisions or sign legal documents. ? Take care of children on your own. Eating and drinking  Follow the diet that is recommended by your health care provider.  If you vomit, drink water, juice, or soup when you can drink without vomiting.  Make sure you have little or no nausea before eating solid foods. General instructions  Take over-the-counter and prescription medicines only as told by your health care provider.  If you have sleep apnea, surgery and certain medicines can increase your risk for breathing problems. Follow instructions from your health care provider about wearing your sleep device: ? Anytime you are sleeping, including during daytime naps. ? While taking prescription pain medicines, sleeping medicines, or medicines that make you drowsy.  If you smoke, do not smoke without supervision.  Keep all follow-up visits as told by your health care provider. This is important. Contact a health care provider if:  You keep feeling nauseous or you keep vomiting.  You feel light-headed.  You develop a rash.  You have a fever. Get help right away if:  You have trouble breathing. Summary  For several hours after your procedure, you may feel sleepy and have poor judgment.  Have a responsible adult stay with you for at least 24 hours or until you are awake and alert. This information is not intended to replace advice given to you by your health care provider. Make sure you discuss any questions you have with your health care provider. Document Revised: 04/02/2017 Document Reviewed: 04/25/2015 Elsevier Patient Education  [place].

## 2019-09-01 LAB — SURGICAL PATHOLOGY

## 2019-09-02 ENCOUNTER — Encounter (HOSPITAL_COMMUNITY): Payer: Self-pay | Admitting: Gastroenterology

## 2019-09-09 ENCOUNTER — Telehealth: Payer: Self-pay

## 2019-09-09 NOTE — Telephone Encounter (Signed)
I called pt to remind him he has one more LFT due for aubagio. I spoke to pt's wife per DPR. Pt has a lab appt on 09/12/2019.

## 2019-09-12 ENCOUNTER — Other Ambulatory Visit: Payer: Self-pay | Admitting: Neurology

## 2019-09-12 DIAGNOSIS — G35 Multiple sclerosis: Secondary | ICD-10-CM | POA: Diagnosis not present

## 2019-09-12 DIAGNOSIS — Z79899 Other long term (current) drug therapy: Secondary | ICD-10-CM | POA: Diagnosis not present

## 2019-09-13 LAB — HEPATIC FUNCTION PANEL
ALT: 26 IU/L (ref 0–44)
AST: 22 IU/L (ref 0–40)
Albumin: 4.9 g/dL (ref 3.8–4.9)
Alkaline Phosphatase: 69 IU/L (ref 48–121)
Bilirubin Total: 0.4 mg/dL (ref 0.0–1.2)
Bilirubin, Direct: 0.14 mg/dL (ref 0.00–0.40)
Total Protein: 6.9 g/dL (ref 6.0–8.5)

## 2019-11-13 ENCOUNTER — Ambulatory Visit (HOSPITAL_COMMUNITY)
Admission: EM | Admit: 2019-11-13 | Discharge: 2019-11-13 | Disposition: A | Discharge status: Home / Self Care | Payer: BC Managed Care – PPO

## 2019-12-02 DIAGNOSIS — G35 Multiple sclerosis: Secondary | ICD-10-CM | POA: Diagnosis not present

## 2019-12-02 DIAGNOSIS — I10 Essential (primary) hypertension: Secondary | ICD-10-CM | POA: Diagnosis not present

## 2019-12-02 DIAGNOSIS — M5440 Lumbago with sciatica, unspecified side: Secondary | ICD-10-CM | POA: Diagnosis not present

## 2019-12-02 DIAGNOSIS — Z6825 Body mass index (BMI) 25.0-25.9, adult: Secondary | ICD-10-CM | POA: Diagnosis not present

## 2019-12-08 ENCOUNTER — Other Ambulatory Visit (HOSPITAL_COMMUNITY): Payer: Self-pay | Admitting: Neurosurgery

## 2019-12-08 DIAGNOSIS — M5440 Lumbago with sciatica, unspecified side: Secondary | ICD-10-CM

## 2019-12-19 ENCOUNTER — Other Ambulatory Visit: Payer: Self-pay

## 2019-12-19 ENCOUNTER — Ambulatory Visit (HOSPITAL_COMMUNITY)
Admission: RE | Admit: 2019-12-19 | Discharge: 2019-12-19 | Disposition: A | Discharge status: Home / Self Care | Payer: BC Managed Care – PPO | Source: Ambulatory Visit | Attending: Neurosurgery | Admitting: Neurosurgery

## 2019-12-19 DIAGNOSIS — M5440 Lumbago with sciatica, unspecified side: Secondary | ICD-10-CM | POA: Diagnosis not present

## 2019-12-19 DIAGNOSIS — M545 Low back pain, unspecified: Secondary | ICD-10-CM | POA: Diagnosis not present

## 2020-02-11 ENCOUNTER — Ambulatory Visit: Payer: BC Managed Care – PPO | Admitting: Neurology

## 2020-02-11 ENCOUNTER — Other Ambulatory Visit: Payer: Self-pay

## 2020-02-11 ENCOUNTER — Encounter: Payer: Self-pay | Admitting: Neurology

## 2020-02-11 VITALS — BP 150/84 | HR 82 | Ht 68.0 in | Wt 166.5 lb

## 2020-02-11 DIAGNOSIS — R200 Anesthesia of skin: Secondary | ICD-10-CM

## 2020-02-11 DIAGNOSIS — E559 Vitamin D deficiency, unspecified: Secondary | ICD-10-CM

## 2020-02-11 DIAGNOSIS — Z79899 Other long term (current) drug therapy: Secondary | ICD-10-CM | POA: Diagnosis not present

## 2020-02-11 DIAGNOSIS — E538 Deficiency of other specified B group vitamins: Secondary | ICD-10-CM | POA: Diagnosis not present

## 2020-02-11 DIAGNOSIS — G35 Multiple sclerosis: Secondary | ICD-10-CM | POA: Diagnosis not present

## 2020-02-11 NOTE — Progress Notes (Signed)
[facility]  PATIENT: Ruben Allen DOB: [birth year]  REFERRING DOCTOR OR PCP: Dr. Gerarda Fraction SOURCE: Patient, notes from primary care, imaging and lab reports from recent hospitalization, MRI and CT images personally reviewed  _________________________________   HISTORICAL  CHIEF COMPLAINT:  Chief Complaint  Patient presents with  . Follow-up    RM 12, alone. Last seen 08/07/2019. On [name] for MS, tolerating well. Gets a lot of tingling when he is on his feet a lot. Legs feel weak/has vibration sensation. Unsure if this is related to his MS or back issues. Denies any falls.     HISTORY OF PRESENT ILLNESS:   Update 02/11/2020: He has RRMS and he started [name] March 2021.   He has tolerated it fairly.He denies any exacerbation or major new symptom.   He is noting a vibrating sensation in his legs when he walks longer.  He has some achiness (more uncomfortable than pain).   He continues to experience fatigue, especially right after he is more active -- rest helps  Gait and balance are fine.   Strength is fine.   He has numbness and tingling in his left face, feet and hands.  SOme of the numbness is more for a few minutes when the limb goes to sleep.     He gets some stabbing pains at times.  He also has    He has a heat sensation in the left flank.   Strength and gait are fine.   He notes vision is mildly worse.    He notes mildly reduced color vision OD  He gets fatigue,  sometimes extreme enough that he feels he needs to lay down.  Fatigue is worse with heat.  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  He has fans at work but rarely is in A/C.   He sleeps well.   He snores and has had some witnessed OSA but has no interest in CPAP.    He does not doze off on weekdays but often on weekends if less active.  His dog wakes him up once a night and wife with reent surgery is sleeping worse which also wakes him up some.      Mood is ok  with occasional irritability but no outright depression.  He is on celexa.     MS History:   He was seen at the [facility] emergency room 08/12/2017 after presenting with left-sided facial numbness 08/05/2017.    On 08/04/17, he felt just a little numbness but when her woke up with severe numbness in the lips, inside mouth, tongue, chin and up to the left ear.   He had very mild blurry vision.    He also noted more tiredness.   He felt slightly lightheaded.   He had mild gait ataxia but no clumsiness in the arms or legs.   CT scan was normal but MRI of the brain performed 08/19/2017 showed a faintly enhancing lesion in the left middle cerebellar peduncle and one periventricular focus in the parieto-occipital periventricular white matter that was also present on the 2007 MRI.   He received 3 days of IV Solu-Medrol.  While in the hospital, he also had a CT angiogram of the neck and head and an MRI of the cervical spine.  There is no evidence of demyelination but he did have significant degenerative changes with mild to moderate spinal stenosis at C6-C7 and foraminal narrowing at most of the  other levels, moderately severe at C6-C7.  The CT angiograms were normal.  He was discharged after his steroids were completed.    He did not note benefit during those 3 days in the hospital and he took the next week off.   He began to improve about a week or two later.  His fatigue was still severe. He still felt wobbly.  Symptoms have improved very slowly.   Currently he only has numbness in the corner of his mouth on the left.  Additionally the inside of his mouth feels weird.   He still has a lot of fatigue.  Eleven years ago he had vertigo x 6 months and was diagnosed with vestibular disorder,    MRI showed one PV lesion.   DMT Medications:   [name] started March 2021.  IMAGING REVIEW MRI Brain 03/21/2019:  T2/flair hyperintense foci in the left middle cerebellar peduncle and in the juxtacortical and deep white matter  of the hemispheres.  One focus in the right parietal lobe in the juxtacortical white matter was not present on the previous MRI and enhances on the current MRI.  This is consistent with an active demyelinating plaque.  An additional nonenhancing focus in the left posterior frontal lobe was not present on the previous MRI.  MRI 06/17/2018 showed possibly one new focus compared to 2019.    MRi 08/19/2017 and 08/20/2017 of the brain, cervical spine and the CT angiograms.  The MRI of the brain shows a focus in the left middle cerebellar peduncle/posterior pons.  There was subtle enhancement seen on the coronal postcontrast images.   The focus also appeared on diffusion-weighted images but was not hypointense on ADC maps additionally, there are a couple T2/FLAIR hyperintense foci in the hemispheres in the periventricular region parietal/occipital lobes (2 of the 3 were present on the previous MRI from 2007).   CT angiogram of the head and neck were normal.  MRI of the cervical spine did not show any demyelination.  There is spinal stenosis at C6-C7 and bilateral foraminal narrowing.    T  he MRI from 05/05/2005 was also reviewed.  The brainstem look normal.  The cerebellum was normal.  There were 2 periventricular foci in the right parietal/occipital region  Labs: 08/19/2017:  Standard labs and B12 were normal or near normal with elevated glucose (he was on steroids).   REVIEW OF SYSTEMS: Constitutional: No fevers, chills, sweats, or change in appetite.  He notes fatigue Eyes: No visual changes, double vision, eye pain Ear, nose and throat: No hearing loss, ear pain, nasal congestion, sore throat Cardiovascular: No chest pain, palpitations Respiratory: No shortness of breath at rest or with exertion.   No wheezes GastrointestinaI: No nausea, vomiting, diarrhea, abdominal pain, fecal incontinence Genitourinary: No dysuria, urinary retention or frequency.  No nocturia. Musculoskeletal: No neck pain, back  pain Integumentary: No rash, pruritus, skin lesions Neurological: as above Psychiatric: No depression at this time.  No anxiety Endocrine: No palpitations, diaphoresis, change in appetite, change in weigh or increased thirst Hematologic/Lymphatic: No anemia, purpura, petechiae. Allergic/Immunologic: No itchy/runny eyes, nasal congestion, recent allergic reactions, rashes  ALLERGIES: No Known Allergies  HOME MEDICATIONS:  Current Outpatient Medications:  .  amLODipine (NORVASC) 2.5 MG tablet, Take 2.5 mg by mouth daily., Disp: , Rfl:  .  amoxicillin (AMOXIL) 500 MG capsule, Take 500 mg by mouth 3 (three) times daily., Disp: , Rfl:  .  aspirin EC 81 MG tablet, Take 81 mg by mouth daily at 8  pm. Swallow whole. (1930), Disp: , Rfl:  .  Aspirin-Caffeine (BACK & BODY EXTRA STRENGTH) 500-32.5 MG TABS, Take 2 tablets by mouth 2 (two) times daily as needed (pain)., Disp: , Rfl:  .  citalopram (CELEXA) 20 MG tablet, Take 20 mg by mouth daily at 8 pm. 1930, Disp: , Rfl:  .  dicyclomine (BENTYL) 10 MG capsule, TAKE ONE CAPSULE BY MOUTH TWICE DAILY BEFORE MEAL(S) (Patient taking differently: Take 10 mg by mouth in the morning and at bedtime.), Disp: 60 capsule, Rfl: 5 .  hydrochlorothiazide (HYDRODIURIL) 25 MG tablet, Take 25 mg by mouth daily., Disp: , Rfl:  .  ibuprofen (ADVIL) 200 MG tablet, Take 400 mg by mouth every 8 (eight) hours as needed (pain.)., Disp: , Rfl:  .  loratadine (CLARITIN) 10 MG tablet, Take 10 mg by mouth daily., Disp: , Rfl:  .  pravastatin (PRAVACHOL) 40 MG tablet, Take 40 mg by mouth daily at 8 pm. 1930, Disp: , Rfl:  .  SUTAB [id number] MG TABS, Take 1 tablet by mouth as directed. The day prior to the procedure take 12 tablets with 16 oz of water over a 15 to 20-minute period. One hour later drink an additional 16 oz of water over 30 minute period. Repeat as directed the day of procedure., Disp: , Rfl:  .  Teriflunomide ([name]) 14 MG TABS, Take 14 mg by mouth daily.  (Patient taking differently: Take 14 mg by mouth daily at 6 PM. (1730)), Disp: 30 tablet, Rfl: 12 .  vitamin B-12 (CYANOCOBALAMIN) 1000 MCG tablet, Take 1,000 mcg by mouth daily., Disp: , Rfl:  .  vitamin C (ASCORBIC ACID) 500 MG tablet, Take 500 mg by mouth daily., Disp: , Rfl:   PAST MEDICAL HISTORY: Past Medical History:  Diagnosis Date  . High cholesterol   . Hypertension    x 5 yrs  . MS (multiple sclerosis) ([place]) 03/2019  . Vision abnormalities     PAST SURGICAL HISTORY: Past Surgical History:  Procedure Laterality Date  . BIOPSY  08/29/2019   Procedure: BIOPSY;  Surgeon: Harvel Quale, MD;  [facility]: [facility];  Service: Gastroenterology;;  . cardiac cath    . COLONOSCOPY N/A 02/19/2013   Procedure: COLONOSCOPY;  Surgeon: Rogene Houston, MD;  Location: [facility];  Service: Endoscopy;  Laterality: N/A;  145-moved to 320 Ann to notify pt  . COLONOSCOPY WITH PROPOFOL N/A 08/29/2019   Procedure: COLONOSCOPY WITH PROPOFOL;  Surgeon: Harvel Quale, MD;  Location: [facility];  Service: Gastroenterology;  Laterality: N/A;  730  . GANGLION CYST EXCISION Left   . Thumb surgery Left    trigger finger    FAMILY HISTORY: Family History  Problem Relation Age of Onset  . Heart disease Mother   . COPD Mother   . Colon cancer Father   . Other Sister        "some form of autoimmune disease"  . Diabetes type II Sister   . COPD Sister   . Other Brother 81       Motorcycle Accident  . Other Brother 31       Heroin OD  . Colon cancer Brother   . Multiple sclerosis Cousin   . Multiple sclerosis Cousin     SOCIAL HISTORY:  Social History   Socioeconomic History  . Marital status: Married    Spouse name: Not on file  . Number of children: 0  . Years of education: 9  . Highest education  level: Not on file  Occupational History  . Occupation: Isometrics   Tobacco Use  . Smoking status: Current Every Day Smoker    Packs/day: 1.00    Years:  25.00    Pack years: 25.00    Types: Cigarettes  . Smokeless tobacco: Never Used  . Tobacco comment: quit a couple of years after smoking 35yrs.  Vaping Use  . Vaping Use: Never used  Substance and Sexual Activity  . Alcohol use: Yes    Comment: weekends- few beers  . Drug use: No  . Sexual activity: Yes  Other Topics Concern  . Not on file  Social History Narrative   Right handed    Coffee daily   Social Determinants of Health   Financial Resource Strain: Not on file  Food Insecurity: Not on file  Transportation Needs: Not on file  Physical Activity: Not on file  Stress: Not on file  Social Connections: Not on file  Intimate Partner Violence: Not on file     PHYSICAL EXAM  Vitals:   02/11/20 0810  BP: (!) 150/84  Pulse: 82  Weight: 166 lb 8 oz (75.5 kg)  Height: 5\' 8"  (1.727 m)    Body mass index is 25.32 kg/m.   General: The patient is well-developed and well-nourished and in no acute distress   Neurologic Exam  Mental status: The patient is alert and oriented x 3 at the time of the examination. The patient has apparent normal recent and remote memory, with an apparently normal attention span and concentration ability.   Speech is normal.  Cranial nerves: Extraocular movements are full.  Facial strength was normal.  Facial sensation was near normal with minimal numbness in the lower left lip.  Trapezius strength was normal.  No obvious hearing deficits are noted.  Motor:  Muscle bulk is normal.   Tone is normal. Strength is  5 / 5 in all 4 extremities except 4+/5 in the right triceps.  Sensory: Intact to touch and vibration in limbs  Coordination: Cerebellar testing reveals good finger-nose-finger and heel-to-shin bilaterally.  Gait and station: Station is normal.   Gait is normal and tandem gait minimally wide.  Romberg is negative.  Reflexes: Deep tendon reflexes are symmetric and normal in arms and increased with legs with mild crossed adductor  responses,.       DIAGNOSTIC DATA (LABS, IMAGING, TESTING) - I reviewed patient records, labs, notes, testing and imaging myself where available.  Lab Results  Component Value Date   WBC 8.2 08/07/2019   HGB 15.0 08/07/2019   HCT 43.9 08/07/2019   MCV 87 08/07/2019   PLT 212 08/07/2019      Component Value Date/Time   NA 137 08/27/2019 1443   K 3.3 (L) 08/27/2019 1443   CL 100 08/27/2019 1443   CO2 25 08/27/2019 1443   GLUCOSE 139 (H) 08/27/2019 1443   BUN 20 08/27/2019 1443   CREATININE 1.10 08/27/2019 1443   CALCIUM 8.9 08/27/2019 1443   PROT 6.9 09/12/2019 0827   ALBUMIN 4.9 09/12/2019 0827   ALBUMIN 4.8 10/26/2017 0920   AST 22 09/12/2019 0827   ALT 26 09/12/2019 0827   ALKPHOS 69 09/12/2019 0827   BILITOT 0.4 09/12/2019 0827   GFRNONAA >60 08/27/2019 1443   GFRAA >60 08/27/2019 1443   Lab Results  Component Value Date   CHOL 189 08/20/2017   HDL 60 08/20/2017   LDLCALC 117 (H) 08/20/2017   TRIG 58 08/20/2017   CHOLHDL 3.2 08/20/2017  Lab Results  Component Value Date   HGBA1C 5.8 (H) 08/20/2017   Lab Results  Component Value Date   [id number] 08/20/2017   No results found for: TSH     ASSESSMENT AND PLAN  Multiple sclerosis ([place]) - Plan: CBC with Differential/Platelet, Comprehensive metabolic panel  High risk medication use - Plan: CBC with Differential/Platelet, Comprehensive metabolic panel  Left sided numbness  Vitamin D deficiency - Plan: VITAMIN D 25 Hydroxy (Vit-D Deficiency, Fractures)  Vitamin B12 deficiency - Plan: Vitamin B12   1.   Continue [name]. Check labwork.    Will need MRI brain later in years at next visit to determine if any subclinical progression,  Consider stronger DMT if occurring.   2.   Check labs including D and B12 (were low in past) 3.   Stay active and exercise as tolerated.   4.   He will return to see me in 6 months .   Fawne Hughley A. Felecia Shelling, MD, PhD, FAAN Certified in Neurology, Clinical Neurophysiology,  Sleep Medicine, Pain Medicine and Neuroimaging Director, [place] [facility] [address], [place], [place] [address] [phone]

## 2020-02-12 LAB — COMPREHENSIVE METABOLIC PANEL
ALT: 27 IU/L (ref 0–44)
AST: 23 IU/L (ref 0–40)
Albumin/Globulin Ratio: 2.2 (ref 1.2–2.2)
Albumin: 4.9 g/dL (ref 3.8–4.9)
Alkaline Phosphatase: 63 IU/L (ref 44–121)
BUN/Creatinine Ratio: 19 (ref 9–20)
BUN: 21 mg/dL (ref 6–24)
Bilirubin Total: 0.3 mg/dL (ref 0.0–1.2)
CO2: 25 mmol/L (ref 20–29)
Calcium: 9.7 mg/dL (ref 8.7–10.2)
Chloride: 101 mmol/L (ref 96–106)
Creatinine, Ser: 1.13 mg/dL (ref 0.76–1.27)
GFR calc Af Amer: 82 mL/min/{1.73_m2} (ref >59)
GFR calc non Af Amer: 71 mL/min/{1.73_m2} (ref >59)
Globulin, Total: 2.2 g/dL (ref 1.5–4.5)
Glucose: 105 mg/dL — ABNORMAL HIGH (ref 65–99)
Potassium: 3.7 mmol/L (ref 3.5–5.2)
Sodium: 140 mmol/L (ref 134–144)
Total Protein: 7.1 g/dL (ref 6.0–8.5)

## 2020-02-12 LAB — CBC WITH DIFFERENTIAL/PLATELET
Basophils Absolute: 0 10*3/uL (ref 0.0–0.2)
Basos: 1 %
EOS (ABSOLUTE): 0.3 10*3/uL (ref 0.0–0.4)
Eos: 4 %
Hematocrit: 43.7 % (ref 37.5–51.0)
Hemoglobin: 14.9 g/dL (ref 13.0–17.7)
Immature Grans (Abs): 0 10*3/uL (ref 0.0–0.1)
Immature Granulocytes: 0 %
Lymphocytes Absolute: 1.7 10*3/uL (ref 0.7–3.1)
Lymphs: 23 %
MCH: 30.1 pg (ref 26.6–33.0)
MCHC: 34.1 g/dL (ref 31.5–35.7)
MCV: 88 fL (ref 79–97)
Monocytes Absolute: 0.8 10*3/uL (ref 0.1–0.9)
Monocytes: 11 %
Neutrophils Absolute: 4.4 10*3/uL (ref 1.4–7.0)
Neutrophils: 61 %
Platelets: 190 10*3/uL (ref 150–450)
RBC: 4.95 x10E6/uL (ref 4.14–5.80)
RDW: 12.2 % (ref 11.6–15.4)
WBC: 7.2 10*3/uL (ref 3.4–10.8)

## 2020-02-12 LAB — VITAMIN D 25 HYDROXY (VIT D DEFICIENCY, FRACTURES): Vit D, 25-Hydroxy: 25.9 ng/mL — ABNORMAL LOW (ref 30.0–100.0)

## 2020-02-12 LAB — VITAMIN B12: Vitamin B-12: 1018 pg/mL (ref 232–1245)

## 2020-04-05 ENCOUNTER — Other Ambulatory Visit: Payer: Self-pay | Admitting: *Deleted

## 2020-04-05 MED ORDER — GABAPENTIN 300 MG PO CAPS
300.0000 mg | ORAL_CAPSULE | Freq: Every day | ORAL | 3 refills | Status: DC
Start: 2020-04-05 — End: 2021-05-16

## 2020-04-25 ENCOUNTER — Other Ambulatory Visit: Payer: Self-pay | Admitting: Neurology

## 2020-04-25 DIAGNOSIS — G35 Multiple sclerosis: Secondary | ICD-10-CM

## 2020-06-28 ENCOUNTER — Telehealth: Payer: Self-pay | Admitting: *Deleted

## 2020-06-28 NOTE — Telephone Encounter (Signed)
Submitted PA [name] on CMM. Key: [id number]. Waiting on determination from [place].

## 2020-06-28 NOTE — Telephone Encounter (Signed)
Faxed notice of approval to [employer] one to one at [phone]. Received fax confirmation.

## 2020-06-28 NOTE — Telephone Encounter (Signed)
PA approved effective from 06/28/2020 through 06/27/2021.

## 2020-08-02 DIAGNOSIS — Z1331 Encounter for screening for depression: Secondary | ICD-10-CM | POA: Diagnosis not present

## 2020-08-02 DIAGNOSIS — Z1389 Encounter for screening for other disorder: Secondary | ICD-10-CM | POA: Diagnosis not present

## 2020-08-02 DIAGNOSIS — E782 Mixed hyperlipidemia: Secondary | ICD-10-CM | POA: Diagnosis not present

## 2020-08-02 DIAGNOSIS — G35 Multiple sclerosis: Secondary | ICD-10-CM | POA: Diagnosis not present

## 2020-08-02 DIAGNOSIS — Z0000 Encounter for general adult medical examination without abnormal findings: Secondary | ICD-10-CM | POA: Diagnosis not present

## 2020-08-02 DIAGNOSIS — Z23 Encounter for immunization: Secondary | ICD-10-CM | POA: Diagnosis not present

## 2020-08-02 DIAGNOSIS — R5383 Other fatigue: Secondary | ICD-10-CM | POA: Diagnosis not present

## 2020-08-02 DIAGNOSIS — E663 Overweight: Secondary | ICD-10-CM | POA: Diagnosis not present

## 2020-08-02 DIAGNOSIS — Z6825 Body mass index (BMI) 25.0-25.9, adult: Secondary | ICD-10-CM | POA: Diagnosis not present

## 2020-08-02 DIAGNOSIS — G379 Demyelinating disease of central nervous system, unspecified: Secondary | ICD-10-CM | POA: Diagnosis not present

## 2020-08-02 DIAGNOSIS — I10 Essential (primary) hypertension: Secondary | ICD-10-CM | POA: Diagnosis not present

## 2020-08-11 ENCOUNTER — Telehealth: Payer: Self-pay | Admitting: Neurology

## 2020-08-11 ENCOUNTER — Other Ambulatory Visit: Payer: Self-pay

## 2020-08-11 ENCOUNTER — Ambulatory Visit: Payer: BC Managed Care – PPO | Admitting: Neurology

## 2020-08-11 ENCOUNTER — Encounter: Payer: Self-pay | Admitting: Neurology

## 2020-08-11 VITALS — BP 135/81 | HR 72 | Ht 68.0 in | Wt 168.0 lb

## 2020-08-11 DIAGNOSIS — G35 Multiple sclerosis: Secondary | ICD-10-CM

## 2020-08-11 DIAGNOSIS — R208 Other disturbances of skin sensation: Secondary | ICD-10-CM | POA: Insufficient documentation

## 2020-08-11 DIAGNOSIS — M5416 Radiculopathy, lumbar region: Secondary | ICD-10-CM

## 2020-08-11 DIAGNOSIS — Z79899 Other long term (current) drug therapy: Secondary | ICD-10-CM

## 2020-08-11 DIAGNOSIS — E559 Vitamin D deficiency, unspecified: Secondary | ICD-10-CM | POA: Diagnosis not present

## 2020-08-11 NOTE — Telephone Encounter (Addendum)
MRI brain w/wo contrast & cervical spine w/wo contrast Dillon Bjork: [id number] (08/11/20-09/09/20)  Scheduled at [facility] 08/18/20 at 4:00 pm

## 2020-08-11 NOTE — Progress Notes (Signed)
[facility]  PATIENT: Ruben Allen DOB: [birth year]  REFERRING DOCTOR OR PCP: Dr. Gerarda Allen SOURCE: Patient, notes from primary care, imaging and lab reports from recent hospitalization, MRI and CT images personally reviewed  _________________________________   HISTORICAL  CHIEF COMPLAINT:  Chief Complaint  Patient presents with   Follow-up    Rm 1, w/ wife Ruben Allen. Pt here for 6 month MS f/u, pt on [name]. Pt reports having nerve pn in his legs. Pt states his legs feel weak and feels like its vibrating, ongoing for 4-5 months, intermittently. States he feels like he's stung by bees or an electrical shock. Pts wife reports he tends to get really tired some days.     HISTORY OF PRESENT ILLNESS:   Update 08/11/2020: He has RRMS and he started [name] March 2021.   He has tolerated it fairly.He denies any exacerbation or major new symptom.     Gait and balance are fine. He is noting more leg dysesthesias.   His legs feel pulsating/vibrating.  He also has a bee sting dysesthesia.   The left arm will sometimes feel heavy but its not heavy.   Legs feel a little weaker when the dysesthesias are occurring.   He has right > left vision issues and has reduced color vision OD.    He continues to experience fatigue, especially in the heat and after long days of activity.   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  He has fans at work but rarely is in A/C and not always near the fan.  He sleeps well, better on gabapentin (takes in evening)   He snores and has had some witnessed OSA but has no interest in CPAP.    He does not doze off on weekdays but often on weekends if less active.     No outright depression but some irritable  He is on Celexa.     He also has degenerative changes in the lumbar spine.  The worst levels L4-L5 where he has left greater than right facet hypertrophy and disc bulging combining to cause moderately severe left  lateral recess stenosis with potential for left L5 nerve root compression.  He is seeing neurosurgery next week.  I discussed with him that an epidural steroid injection at that level might be beneficial.  MS History:   He was seen at the [facility] emergency room 08/12/2017 after presenting with left-sided facial numbness 08/05/2017.    On 08/04/17, he felt just a little numbness but when her woke up with severe numbness in the lips, inside mouth, tongue, chin and up to the left ear.   He had very mild blurry vision.    He also noted more tiredness.   He felt slightly lightheaded.   He had mild gait ataxia but no clumsiness in the arms or legs.   CT scan was normal but MRI of the brain performed 08/19/2017 showed a faintly enhancing lesion in the left middle cerebellar peduncle and one periventricular focus in the parieto-occipital periventricular white matter that was also present on the 2007 MRI.   He received 3 days of IV Solu-Medrol.  While in the hospital, he also had a CT angiogram of the neck and head and an MRI of the cervical spine.  There is no evidence of demyelination but he did have significant degenerative changes with mild to moderate spinal stenosis at C6-C7 and foraminal narrowing at most of the other  levels, moderately severe at C6-C7.  The CT angiograms were normal.  He was discharged after his steroids were completed.    He did not note benefit during those 3 days in the hospital and he took the next week off.   He began to improve about a week or two later.  His fatigue was still severe. He still felt wobbly.  Symptoms have improved very slowly.   Currently he only has numbness in the corner of his mouth on the left.  Additionally the inside of his mouth feels weird.   He still has a lot of fatigue.  Eleven years ago he had vertigo x 6 months and was diagnosed with vestibular disorder,    MRI showed one PV lesion.   DMT Medications:   [name] started March 2021.  IMAGING REVIEW MRI  Brain 03/21/2019:  T2/flair hyperintense foci in the left middle cerebellar peduncle and in the juxtacortical and deep white matter of the hemispheres.  One focus in the right parietal lobe in the juxtacortical white matter was not present on the previous MRI and enhances on the current MRI.  This is consistent with an active demyelinating plaque.  An additional nonenhancing focus in the left posterior frontal lobe was not present on the previous MRI.  MRI 06/17/2018 showed possibly one new focus compared to 2019.    MRi 08/19/2017 and 08/20/2017 of the brain, cervical spine and the CT angiograms.  The MRI of the brain shows a focus in the left middle cerebellar peduncle/posterior pons.  There was subtle enhancement seen on the coronal postcontrast images.   The focus also appeared on diffusion-weighted images but was not hypointense on ADC maps additionally, there are a couple T2/FLAIR hyperintense foci in the hemispheres in the periventricular region parietal/occipital lobes (2 of the 3 were present on the previous MRI from 2007).   CT angiogram of the head and neck were normal.  MRI of the cervical spine did not show any demyelination.  There is spinal stenosis at C6-C7 and bilateral foraminal narrowing.    T  he MRI from 05/05/2005 was also reviewed.  The brainstem look normal.  The cerebellum was normal.  There were 2 periventricular foci in the right parietal/occipital region  MRI of the lumbar spine 12/19/2019 was reviewed today.  It shows degenerative changes at several levels.  At L4-L5, a combination of left greater than right facet hypertrophy and disc bulging leads to moderately severe left lateral recess stenosis with potential for left L5 nerve root compression.  There is moderate lateral recess stenosis to the right with less potential for nerve root compression.  Facet hypertrophy also noted at 3 4 and 5 1.  Labs: 08/19/2017:  Standard labs and B12 were normal or near normal with elevated glucose (he  was on steroids).   REVIEW OF SYSTEMS: Constitutional: No fevers, chills, sweats, or change in appetite.  He notes fatigue Eyes: No visual changes, double vision, eye pain Ear, nose and throat: No hearing loss, ear pain, nasal congestion, sore throat Cardiovascular: No chest pain, palpitations Respiratory:  No shortness of breath at rest or with exertion.   No wheezes GastrointestinaI: No nausea, vomiting, diarrhea, abdominal pain, fecal incontinence Genitourinary:  No dysuria, urinary retention or frequency.  No nocturia. Musculoskeletal:  No neck pain, back pain Integumentary: No rash, pruritus, skin lesions Neurological: as above Psychiatric: No depression at this time.  No anxiety Endocrine: No palpitations, diaphoresis, change in appetite, change in weigh or increased thirst Hematologic/Lymphatic:  No anemia, purpura, petechiae. Allergic/Immunologic: No itchy/runny eyes, nasal congestion, recent allergic reactions, rashes  ALLERGIES: No Known Allergies  HOME MEDICATIONS:  Current Outpatient Medications:    amLODipine (NORVASC) 2.5 MG tablet, Take 2.5 mg by mouth daily., Disp: , Rfl:    aspirin EC 81 MG tablet, Take 81 mg by mouth daily at 8 pm. Swallow whole. (1930), Disp: , Rfl:    Aspirin-Caffeine (BACK & BODY EXTRA STRENGTH) 500-32.5 MG TABS, Take 2 tablets by mouth 2 (two) times daily as needed (pain)., Disp: , Rfl:    [name] 14 MG TABS, TAKE 1 TABLET (14 MG) DAILY, Disp: 30 tablet, Rfl: 11   citalopram (CELEXA) 20 MG tablet, Take 20 mg by mouth daily at 8 pm. 1930, Disp: , Rfl:    dicyclomine (BENTYL) 10 MG capsule, TAKE ONE CAPSULE BY MOUTH TWICE DAILY BEFORE MEAL(S) (Patient taking differently: Take 10 mg by mouth in the morning and at bedtime.), Disp: 60 capsule, Rfl: 5   gabapentin (NEURONTIN) 300 MG capsule, Take 1 capsule (300 mg total) by mouth at bedtime., Disp: 90 capsule, Rfl: 3   hydrochlorothiazide (HYDRODIURIL) 25 MG tablet, Take 25 mg by mouth daily., Disp: ,  Rfl:    ibuprofen (ADVIL) 200 MG tablet, Take 400 mg by mouth every 8 (eight) hours as needed (pain.)., Disp: , Rfl:    loratadine (CLARITIN) 10 MG tablet, Take 10 mg by mouth daily., Disp: , Rfl:    pravastatin (PRAVACHOL) 40 MG tablet, Take 40 mg by mouth daily at 8 pm. 1930, Disp: , Rfl:    vitamin B-12 (CYANOCOBALAMIN) 1000 MCG tablet, Take 1,000 mcg by mouth daily., Disp: , Rfl:    vitamin C (ASCORBIC ACID) 500 MG tablet, Take 500 mg by mouth daily., Disp: , Rfl:   PAST MEDICAL HISTORY: Past Medical History:  Diagnosis Date   High cholesterol    Hypertension    x 5 yrs   MS (multiple sclerosis) ([place]) 03/2019   Vision abnormalities     PAST SURGICAL HISTORY: Past Surgical History:  Procedure Laterality Date   BIOPSY  08/29/2019   Procedure: BIOPSY;  Surgeon: Harvel Quale, MD;  Location: [facility];  Service: Gastroenterology;;   cardiac cath     COLONOSCOPY N/A 02/19/2013   Procedure: COLONOSCOPY;  Surgeon: Rogene Houston, MD;  Location: [facility];  Service: Endoscopy;  Laterality: N/A;  145-moved to 320 Ann to notify pt   COLONOSCOPY WITH PROPOFOL N/A 08/29/2019   Procedure: COLONOSCOPY WITH PROPOFOL;  Surgeon: Harvel Quale, MD;  Location: [facility];  Service: Gastroenterology;  Laterality: N/A;  730   GANGLION CYST EXCISION Left    Thumb surgery Left    trigger finger    FAMILY HISTORY: Family History  Problem Relation Age of Onset   Heart disease Mother    COPD Mother    Colon cancer Father    Other Sister        "some form of autoimmune disease"   Diabetes type II Sister    COPD Sister    Other Brother 42       Motorcycle Accident   Other Brother 31       Heroin OD   Colon cancer Brother    Multiple sclerosis Cousin    Multiple sclerosis Cousin     SOCIAL HISTORY:  Social History   Socioeconomic History   Marital status: Married    Spouse name: Not on file   Number of children: 0  Years of education: 9   Highest  education level: Not on file  Occupational History   Occupation: [employer]   Tobacco Use   Smoking status: Every Day    Packs/day: 1.00    Years: 25.00    Pack years: 25.00    Types: Cigarettes   Smokeless tobacco: Never   Tobacco comments:    quit a couple of years after smoking 29yr.  Vaping Use   Vaping Use: Never used  Substance and Sexual Activity   Alcohol use: Yes    Comment: weekends- few beers   Drug use: No   Sexual activity: Yes  Other Topics Concern   Not on file  Social History Narrative   Right handed    Coffee daily   Social Determinants of Health   Financial Resource Strain: Not on file  Food Insecurity: Not on file  Transportation Needs: Not on file  Physical Activity: Not on file  Stress: Not on file  Social Connections: Not on file  Intimate Partner Violence: Not on file     PHYSICAL EXAM  Vitals:   08/11/20 0814  BP: 135/81  Pulse: 72  Weight: 168 lb (76.2 kg)  Height: '5\' 8"'$  (1.727 m)    Body mass index is 25.54 kg/m.   General: The patient is well-developed and well-nourished and in no acute distress   Neurologic Exam  Mental status: The patient is alert and oriented x 3 at the time of the examination. The patient has apparent normal recent and remote memory, with an apparently normal attention span and concentration ability.   Speech is normal.  Cranial nerves: Extraocular movements are full.  Facial strength was normal.  Facial sensation was near normal with minimal numbness in the lower left lip.  Trapezius strength was normal.  No obvious hearing deficits are noted.  Motor:  Muscle bulk is normal.   Tone is normal. Strength is  5 / 5 in all 4 extremities except 4+/5 in the right triceps.  Sensory: Intact to touch and vibration sensation in the arms and legs  Coordination: Cerebellar testing reveals good finger-nose-finger and heel-to-shin bilaterally.  Gait and station: Station is normal.   His gait is normal.  The tandem  gait is mildly wide.  Romberg is negative.  Reflexes: Deep tendon reflexes are symmetric and normal in arms and increased with legs with mild crossed adductor responses,.       DIAGNOSTIC DATA (LABS, IMAGING, TESTING) - I reviewed patient records, labs, notes, testing and imaging myself where available.  Lab Results  Component Value Date   WBC 7.2 02/11/2020   HGB 14.9 02/11/2020   HCT 43.7 02/11/2020   MCV 88 02/11/2020   PLT 190 02/11/2020      Component Value Date/Time   NA 140 02/11/2020 1509   K 3.7 02/11/2020 1509   CL 101 02/11/2020 1509   CO2 25 02/11/2020 1509   GLUCOSE 105 (H) 02/11/2020 1509   GLUCOSE 139 (H) 08/27/2019 1443   BUN 21 02/11/2020 1509   CREATININE 1.13 02/11/2020 1509   CALCIUM 9.7 02/11/2020 1509   PROT 7.1 02/11/2020 1509   ALBUMIN 4.9 02/11/2020 1509   ALBUMIN 4.8 10/26/2017 0920   AST 23 02/11/2020 1509   ALT 27 02/11/2020 1509   ALKPHOS 63 02/11/2020 1509   BILITOT 0.3 02/11/2020 1509   GFRNONAA 71 02/11/2020 1509   GFRAA 82 02/11/2020 1509   Lab Results  Component Value Date   CHOL 189 08/20/2017   HDL  60 08/20/2017   LDLCALC 117 (H) 08/20/2017   TRIG 58 08/20/2017   CHOLHDL 3.2 08/20/2017   Lab Results  Component Value Date   HGBA1C 5.8 (H) 08/20/2017   Lab Results  Component Value Date   VITAMINB12 1,018 02/11/2020   No results found for: TSH     ASSESSMENT AND PLAN  Multiple sclerosis (HCC)  High risk medication use  Vitamin D deficiency  Left lumbar radiculopathy  Dysesthesia   1.   Continue [name]. Check labwork.    Will need MRI brain to determine if any subclinical progression,  Consider stronger DMT if occurring.   2.   Continue Vit D supplement 3.   Stay active and exercise as tolerated.   4.   Reasonable to try L4L5 ESI as likely has a left L5 radiculopathy. 5.   He will return to see me in 6 months .   Alsha Meland A. Felecia Shelling, MD, PhD, FAAN Certified in Neurology, Clinical Neurophysiology, Sleep  Medicine, Pain Medicine and Neuroimaging Director, [place] [facility] [address] [place], [place] [address] [phone]

## 2020-08-18 ENCOUNTER — Other Ambulatory Visit: Payer: Self-pay

## 2020-08-18 ENCOUNTER — Ambulatory Visit: Payer: BC Managed Care – PPO

## 2020-08-18 DIAGNOSIS — G35 Multiple sclerosis: Secondary | ICD-10-CM

## 2020-08-18 MED ORDER — GADOBENATE DIMEGLUMINE 529 MG/ML IV SOLN
15.0000 mL | Freq: Once | INTRAVENOUS | Status: AC | PRN
  Administered 2020-08-18: 15 mL via INTRAVENOUS

## 2020-08-19 DIAGNOSIS — L821 Other seborrheic keratosis: Secondary | ICD-10-CM | POA: Diagnosis not present

## 2020-08-19 DIAGNOSIS — L02821 Furuncle of head [any part, except face]: Secondary | ICD-10-CM | POA: Diagnosis not present

## 2020-08-19 DIAGNOSIS — L728 Other follicular cysts of the skin and subcutaneous tissue: Secondary | ICD-10-CM | POA: Diagnosis not present

## 2020-08-19 DIAGNOSIS — B9689 Other specified bacterial agents as the cause of diseases classified elsewhere: Secondary | ICD-10-CM | POA: Diagnosis not present

## 2021-04-08 ENCOUNTER — Other Ambulatory Visit: Payer: Self-pay | Admitting: Neurology

## 2021-04-08 DIAGNOSIS — G35 Multiple sclerosis: Secondary | ICD-10-CM

## 2021-04-11 ENCOUNTER — Other Ambulatory Visit: Payer: Self-pay

## 2021-04-11 ENCOUNTER — Emergency Department (HOSPITAL_COMMUNITY)
Admission: EM | Admit: 2021-04-11 | Discharge: 2021-04-11 | Disposition: A | Discharge status: Home / Self Care | Payer: BC Managed Care – PPO | Attending: Emergency Medicine | Admitting: Emergency Medicine

## 2021-04-11 ENCOUNTER — Emergency Department (HOSPITAL_COMMUNITY): Payer: BC Managed Care – PPO

## 2021-04-11 ENCOUNTER — Encounter (HOSPITAL_COMMUNITY): Payer: Self-pay | Admitting: Emergency Medicine

## 2021-04-11 DIAGNOSIS — M5412 Radiculopathy, cervical region: Secondary | ICD-10-CM | POA: Diagnosis not present

## 2021-04-11 DIAGNOSIS — Z79899 Other long term (current) drug therapy: Secondary | ICD-10-CM | POA: Diagnosis not present

## 2021-04-11 DIAGNOSIS — Z7982 Long term (current) use of aspirin: Secondary | ICD-10-CM | POA: Diagnosis not present

## 2021-04-11 DIAGNOSIS — I10 Essential (primary) hypertension: Secondary | ICD-10-CM | POA: Insufficient documentation

## 2021-04-11 DIAGNOSIS — M25511 Pain in right shoulder: Secondary | ICD-10-CM | POA: Diagnosis not present

## 2021-04-11 DIAGNOSIS — M79601 Pain in right arm: Secondary | ICD-10-CM | POA: Diagnosis not present

## 2021-04-11 MED ORDER — NAPROXEN 500 MG PO TABS: 500.0000 mg | ORAL_TABLET | Freq: Two times a day (BID) | ORAL | 0 refills | Status: AC

## 2021-04-11 MED ORDER — DEXAMETHASONE SODIUM PHOSPHATE 10 MG/ML IJ SOLN
10.0000 mg | Freq: Once | INTRAMUSCULAR | Status: AC
  Administered 2021-04-11: 10 mg via INTRAMUSCULAR
  Filled 2021-04-11: qty 1

## 2021-04-11 MED ORDER — OXYCODONE-ACETAMINOPHEN 5-325 MG PO TABS
1.0000 | ORAL_TABLET | Freq: Once | ORAL | Status: AC
  Administered 2021-04-11: 1 via ORAL
  Filled 2021-04-11: qty 1

## 2021-04-11 NOTE — ED Provider Notes (Signed)
?[facility] ?Provider Note ? ? ?CSN: [id number] ?Arrival date & time: 04/11/21  1351 ? ?  ? ?History ? ?Chief Complaint  ?Patient presents with  ? Shoulder Pain  ? ? ?Ruben Allen is a 61 y.o. male. ? ?HPI ? ?61 year old male with a history of hyperlipidemia, hypertension, MS, IBS, CVA, who presents to the emergency department today for evaluation of right upper extremity pain.  Patient states that for the last several days he has had pain that radiates from the neck to the right upper extremity.  Describes pain as a squeezing sensation.  Pain is worse with certain positions of his neck.  He feels relief when he holds his right arm above his head.  He had some pain that radiated to his chest but that is since resolved.  He notes that he does a lot of exertional work at his job with lifting but he has not had any chest pain with those activities.  He denies any persistent shortness of breath.  He does have a history of a herniated disc in his neck.  He denies any recent falls or trauma ? ?Home Medications ?Prior to Admission medications   ?Medication Sig Start Date End Date Taking? Authorizing Provider  ?amLODipine (NORVASC) 2.5 MG tablet Take 2.5 mg by mouth daily.    [provider]  ?aspirin EC 81 MG tablet Take 81 mg by mouth daily at 8 pm. Swallow whole. [id number]    [provider]  ?Aspirin-Caffeine (BACK & BODY EXTRA STRENGTH) 500-32.5 MG TABS Take 2 tablets by mouth 2 (two) times daily as needed (pain).    [provider]  ?citalopram (CELEXA) 20 MG tablet Take 20 mg by mouth daily at 8 pm. 1930    [provider]  ?dicyclomine (BENTYL) 10 MG capsule TAKE ONE CAPSULE BY MOUTH TWICE DAILY BEFORE MEAL(S) ?Patient taking differently: Take 10 mg by mouth in the morning and at bedtime.    Rogene Houston, MD  ?gabapentin (NEURONTIN) 300 MG capsule Take 1 capsule (300 mg total) by mouth at bedtime. 04/05/20   Sater, Nanine Means, MD  ?hydrochlorothiazide  (HYDRODIURIL) 25 MG tablet Take 25 mg by mouth daily.    [provider]  ?ibuprofen (ADVIL) 200 MG tablet Take 400 mg by mouth every 8 (eight) hours as needed (pain.).    [provider]  ?loratadine (CLARITIN) 10 MG tablet Take 10 mg by mouth daily.    [provider]  ?naproxen (NAPROSYN) 500 MG tablet Take 1 tablet (500 mg total) by mouth 2 (two) times daily for 7 days. 04/11/21 04/18/21 Yes Dionta Larke S, PA-C  ?pravastatin (PRAVACHOL) 40 MG tablet Take 40 mg by mouth daily at 8 pm. 1930    [provider]  ?Teriflunomide ([name]) 14 MG TABS Take 1 tablet by mouth daily. Please call and make overdue appt for further refills 1st attempt 04/11/21   Britt Bottom, MD  ?vitamin B-12 (CYANOCOBALAMIN) 1000 MCG tablet Take 1,000 mcg by mouth daily.    [provider]  ?vitamin C (ASCORBIC ACID) 500 MG tablet Take 500 mg by mouth daily.    [provider]  ?   ? ?Allergies    ?Patient has no known allergies.   ? ?Review of Systems   ?Review of Systems ?See HPI for pertinent positives or negatives. ? ? ?Physical Exam ?Updated Vital Signs ?BP (!) 181/96 (BP Location: Left Arm)   Pulse 83   Temp (!) 97.5 ?  F (36.4 ?C) (Oral)   Resp 18   Ht '5\' 5"'$  (1.651 m)   Wt 76.2 kg   SpO2 100%   BMI 27.96 kg/m?  ?Physical Exam ?Vitals and nursing note reviewed.  ?Constitutional:   ?   General: He is not in acute distress. ?   Appearance: He is well-developed.  ?HENT:  ?   Head: Normocephalic and atraumatic.  ?Eyes:  ?   Conjunctiva/sclera: Conjunctivae normal.  ?Cardiovascular:  ?   Rate and Rhythm: Normal rate and regular rhythm.  ?   Heart sounds: No murmur heard. ?Pulmonary:  ?   Effort: Pulmonary effort is normal. No respiratory distress.  ?   Breath sounds: Normal breath sounds.  ?Abdominal:  ?   Palpations: Abdomen is soft.  ?   Tenderness: There is no abdominal tenderness.  ?Musculoskeletal:     ?   General: No swelling.  ?   Cervical back: Neck supple.  ?    Comments: No midline tenderness to the cervical spine but patient does have TTP to the right cervical paraspinous muscles, right trapezius muscles and along the right rhomboids.  He has 5/5 strength to the upper extremities and has normal sensation and distal pulses.  ?Skin: ?   General: Skin is warm and dry.  ?   Capillary Refill: Capillary refill takes less than 2 seconds.  ?Neurological:  ?   Mental Status: He is alert.  ?Psychiatric:     ?   Mood and Affect: Mood normal.  ? ? ? ?ED Results / Procedures / Treatments   ?Labs ?(all labs ordered are listed, but only abnormal results are displayed) ?Labs Reviewed - No data to display ? ?EKG ?EKG Interpretation ? ?Date/Time:  Monday April 11 2021 14:23:28 EDT ?Ventricular Rate:  88 ?PR Interval:  142 ?QRS Duration: 90 ?QT Interval:  376 ?QTC Calculation: 338 ?R Axis:   39 ?Text Interpretation: Normal sinus rhythm Normal ECG When compared with ECG of 27-Aug-2019 14:36, No significant change was found Confirmed by Aletta Edouard [phone]) on 04/11/2021 4:28:11 PM ? ?Radiology ?DG Shoulder Right ? ?Result Date: 04/11/2021 ?CLINICAL DATA:  Pain, no known injury EXAM: RIGHT SHOULDER - 2+ VIEW COMPARISON:  None. FINDINGS: There is no evidence of fracture or dislocation. There is no evidence of arthropathy or other focal bone abnormality. Soft tissues are unremarkable. IMPRESSION: No fracture or dislocation of the right shoulder. Joint spaces are well preserved. Electronically Signed   By: Delanna Ahmadi M.D.   On: 04/11/2021 14:51   ? ?Procedures ?Procedures  ? ?Medications Ordered in ED ?Medications  ?dexamethasone (DECADRON) injection 10 mg (has no administration in time range)  ?oxyCODONE-acetaminophen (PERCOCET/ROXICET) 5-325 MG per tablet 1 tablet (has no administration in time range)  ? ? ?ED Course/ Medical Decision Making/ A&P ?  ?                        ?Medical Decision Making ?Amount and/or Complexity of Data Reviewed ?Radiology: ordered. ? ?Risk ?Prescription drug  management. ? ? ?This patient presents to the ED for concern of right arm pain, this involves an extensive number of treatment options, and is a complaint that carries with it a high risk of complications and morbidity.  The differential diagnosis includes but is not limited to cervical radiculopathy, acs, pe, other msk cause  ? ?Comorbidities that complicate the patient evaluation: ?Patient?s presentation is complicated by their history of ms, cervical disc disease ? ?Additional history obtained: ?  Records reviewed Care Everywhere/External Records ? ?Lab Tests: ?I Ordered, and personally interpreted labs.  The pertinent results include:   ?declined ? ?Imaging Studies ordered: ?I ordered, independently visualized, and interpreted imaging which showed  ?Xray right shoulder - neg for acute abnormality  ? ?I agree with the radiologist interpretation ? ? ?Medicines ordered and prescription drug management: ?I ordered medication including decadron and percocet  for pain  ? ?Test Considered: cardiac labs, cxr - did not order as pt declined  ? ?Critical Interventions: pain meds ? ?Complexity of problems addressed: ?Patient?s presentation is most consistent with  acute, uncomplicated illness ? ?Disposition: ?After consideration of the diagnostic results and the patient?s response to treatment,  I feel that the patent would benefit from discharge home .  Patient symptoms do seem MSK in nature.  I reviewed his prior MRI from August/2022 and he had evidence of cervical disc disease at C5/6 and C6/7 which does seem consistent with his current presentation and areas of pain.  He does not have any acute neurologic deficits on my evaluation.  I did discuss obtaining a cardiac work-up with the patient and and he ultimately declined a cardiac work-up at this time.  His symptoms do seem less likely to be related to ACS.  He was given a dose of Decadron and Percocet here in the ED.  I will give him a referral to neurosurgery for his  symptoms.  I have advised if he has any new or worsening symptoms that he needs to return to the emergency department immediately.  He voices understanding of the plan and reasons to return.  All questions answered.  Patien

## 2021-04-11 NOTE — ED Triage Notes (Signed)
Pt c/o right shoulder and arm pain x 3 days ?

## 2021-04-11 NOTE — Discharge Instructions (Addendum)
You were given a long acting steroid that should last for the next 3 days  ? ?You may alternate taking Tylenol and Naproxen as needed for pain control. You may take Naproxen twice daily as directed on your discharge paperwork and you may take  [phone] mg of Tylenol every 6 hours. Do not exceed 4000 mg of Tylenol daily as this can lead to liver damage. Also, make sure to take Naproxen with meals as it can cause an upset stomach. Do not take other NSAIDs while taking Naproxen such as (Aleve, Ibuprofen, Aspirin, Celebrex, etc) and do not take more than the prescribed dose as this can lead to ulcers and bleeding in your GI tract. You may use warm and cold compresses to help with your symptoms.  ? ?Please follow up with the neurosurgeon within the next 7-10 days for re-evaluation and further treatment of your symptoms.  ? ?Please return to the ER sooner if you have any new or worsening symptoms. ? ? ? ? ?

## 2021-04-26 DIAGNOSIS — Z6825 Body mass index (BMI) 25.0-25.9, adult: Secondary | ICD-10-CM | POA: Diagnosis not present

## 2021-04-26 DIAGNOSIS — M5440 Lumbago with sciatica, unspecified side: Secondary | ICD-10-CM | POA: Diagnosis not present

## 2021-04-27 ENCOUNTER — Other Ambulatory Visit (HOSPITAL_COMMUNITY): Payer: Self-pay | Admitting: Neurosurgery

## 2021-04-27 ENCOUNTER — Other Ambulatory Visit: Payer: Self-pay | Admitting: Neurosurgery

## 2021-04-27 DIAGNOSIS — M5412 Radiculopathy, cervical region: Secondary | ICD-10-CM

## 2021-05-02 ENCOUNTER — Ambulatory Visit (HOSPITAL_COMMUNITY)
Admission: RE | Admit: 2021-05-02 | Discharge: 2021-05-02 | Disposition: A | Discharge status: Home / Self Care | Payer: BC Managed Care – PPO | Source: Ambulatory Visit | Attending: Neurosurgery | Admitting: Neurosurgery

## 2021-05-02 DIAGNOSIS — M2578 Osteophyte, vertebrae: Secondary | ICD-10-CM | POA: Diagnosis not present

## 2021-05-02 DIAGNOSIS — M4802 Spinal stenosis, cervical region: Secondary | ICD-10-CM | POA: Diagnosis not present

## 2021-05-02 DIAGNOSIS — M5412 Radiculopathy, cervical region: Secondary | ICD-10-CM | POA: Diagnosis not present

## 2021-05-09 ENCOUNTER — Other Ambulatory Visit: Payer: Self-pay | Admitting: Neurology

## 2021-05-09 DIAGNOSIS — G35 Multiple sclerosis: Secondary | ICD-10-CM

## 2021-05-10 DIAGNOSIS — Z6826 Body mass index (BMI) 26.0-26.9, adult: Secondary | ICD-10-CM | POA: Diagnosis not present

## 2021-05-10 DIAGNOSIS — M5412 Radiculopathy, cervical region: Secondary | ICD-10-CM | POA: Diagnosis not present

## 2021-05-16 ENCOUNTER — Ambulatory Visit: Payer: BC Managed Care – PPO | Admitting: Family Medicine

## 2021-05-16 ENCOUNTER — Encounter: Payer: Self-pay | Admitting: Family Medicine

## 2021-05-16 VITALS — BP 157/85 | HR 96 | Ht 65.0 in | Wt 172.0 lb

## 2021-05-16 DIAGNOSIS — G35 Multiple sclerosis: Secondary | ICD-10-CM | POA: Diagnosis not present

## 2021-05-16 DIAGNOSIS — Z79899 Other long term (current) drug therapy: Secondary | ICD-10-CM | POA: Diagnosis not present

## 2021-05-16 DIAGNOSIS — R208 Other disturbances of skin sensation: Secondary | ICD-10-CM

## 2021-05-16 DIAGNOSIS — E559 Vitamin D deficiency, unspecified: Secondary | ICD-10-CM | POA: Diagnosis not present

## 2021-05-16 MED ORDER — TERIFLUNOMIDE 14 MG PO TABS: 1.0000 | ORAL_TABLET | Freq: Every day | ORAL | 11 refills | Status: DC

## 2021-05-16 MED ORDER — GABAPENTIN 100 MG PO CAPS: 100.0000 mg | ORAL_CAPSULE | Freq: Three times a day (TID) | ORAL | 3 refills | Status: DC

## 2021-05-16 NOTE — Progress Notes (Signed)
? ? ?Chief Complaint  ?Patient presents with  ? Follow-up  ?  Rm 11, w wife. Here for medication management. On [name] and tolerating well. MS stable. Pt would like to have his gabapentin in '100mg'$  strength. Sometimes doesn't feel like he needs to take. Pt will be having neck surgery on 31st of May.   ? ? ?HISTORY OF PRESENT ILLNESS: ? ?05/16/21 ALL:  ?Ruben Allen is a 61 y.o. male here today for follow up for RRMS. He continues [name]. He is expecting switch to generic next month. MRI brian and cervical spine stable 08/2020.  ? ? ?He feels he is doing well from an MS standpoint. No new or exacerbating symptoms. Gait stable. He continues gabapentin '300mg'$  QHS. He feels that it helps with dysesthesias. He is requesting '100mg'$  capsules as he does not feel he needs to take a full dose every day. He does feel that it helps with more restful sleep.  ? ?Mood is stable on Celexa (PCP). Vitamin D was low in 01/2020. He takes 5000iu daily OTC.  ? ?He is planning ACDF with Dr Saintclair Halsted 5/31. He has had some right sided ear pain for the past couple of weeks. OTC meds ineffective. No fever or chills.  ? ?HISTORY (copied from Dr Garth Bigness previous note) ? ?Update 08/11/2020: ?He has RRMS and he started [facility] March 2021.   He has tolerated it fairly.He denies any exacerbation or major new symptom.    ?  ?Gait and balance are fine. He is noting more leg dysesthesias.   His legs feel pulsating/vibrating.  He also has a bee sting dysesthesia.   The left arm will sometimes feel heavy but its not heavy.   Legs feel a little weaker when the dysesthesias are occurring.   He has right > left vision issues and has reduced color vision OD.   ?  ?He continues to experience fatigue, especially in the heat and after long days of activity.   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  He has fans at work but rarely is in A/C and not always near the fan. ?  ?He sleeps well, better on gabapentin (takes in evening)    He snores and has had some witnessed OSA but has no interest in CPAP.    He does not doze off on weekdays but often on weekends if less active.    ?  ?No outright depression but some irritable  He is on Celexa.    ?  ?He also has degenerative changes in the lumbar spine.  The worst levels L4-L5 where he has left greater than right facet hypertrophy and disc bulging combining to cause moderately severe left lateral recess stenosis with potential for left L5 nerve root compression.  He is seeing neurosurgery next week.  I discussed with him that an epidural steroid injection at that level might be beneficial. ?  ?MS History:   ?He was seen at the [facility] emergency room 08/12/2017 after presenting with left-sided facial numbness 08/05/2017.    On 08/04/17, he felt just a little numbness but when her woke up with severe numbness in the lips, inside mouth, tongue, chin and up to the left ear.   He had very mild blurry vision.    He also noted more tiredness.   He felt slightly lightheaded.   He had mild gait ataxia but no clumsiness in the arms or legs.   CT scan was  normal but MRI of the brain performed 08/19/2017 showed a faintly enhancing lesion in the left middle cerebellar peduncle and one periventricular focus in the parieto-occipital periventricular white matter that was also present on the 2007 MRI.   He received 3 days of IV Solu-Medrol.  While in the hospital, he also had a CT angiogram of the neck and head and an MRI of the cervical spine.  There is no evidence of demyelination but he did have significant degenerative changes with mild to moderate spinal stenosis at C6-C7 and foraminal narrowing at most of the other levels, moderately severe at C6-C7.  The CT angiograms were normal.  He was discharged after his steroids were completed.   ?  ?He did not note benefit during those 3 days in the hospital and he took the next week off.   He began to improve about a week or two later.  His fatigue was still severe.  He still felt wobbly.  Symptoms have improved very slowly.   Currently he only has numbness in the corner of his mouth on the left.  Additionally the inside of his mouth feels weird.   He still has a lot of fatigue. ?  ?Eleven years ago he had vertigo x 6 months and was diagnosed with vestibular disorder,    MRI showed one PV lesion.  ?  ?DMT Medications:   [name] started March 2021. ?  ?IMAGING REVIEW ?MRI Brain 03/21/2019:  T2/flair hyperintense foci in the left middle cerebellar peduncle and in the juxtacortical and deep white matter of the hemispheres.  One focus in the right parietal lobe in the juxtacortical white matter was not present on the previous MRI and enhances on the current MRI.  This is consistent with an active demyelinating plaque.  An additional nonenhancing focus in the left posterior frontal lobe was not present on the previous MRI. ?  ?MRI 06/17/2018 showed possibly one new focus compared to 2019.   ?  ?MRi 08/19/2017 and 08/20/2017 of the brain, cervical spine and the CT angiograms.  The MRI of the brain shows a focus in the left middle cerebellar peduncle/posterior pons.  There was subtle enhancement seen on the coronal postcontrast images.   The focus also appeared on diffusion-weighted images but was not hypointense on ADC maps additionally, there are a couple T2/FLAIR hyperintense foci in the hemispheres in the periventricular region parietal/occipital lobes (2 of the 3 were present on the previous MRI from 2007).   CT angiogram of the head and neck were normal.  MRI of the cervical spine did not show any demyelination.  There is spinal stenosis at C6-C7 and bilateral foraminal narrowing.    T ?  ?he MRI from 05/05/2005 was also reviewed.  The brainstem look normal.  The cerebellum was normal.  There were 2 periventricular foci in the right parietal/occipital region ?  ?MRI of the lumbar spine 12/19/2019 was reviewed today.  It shows degenerative changes at several levels.  At L4-L5, a combination  of left greater than right facet hypertrophy and disc bulging leads to moderately severe left lateral recess stenosis with potential for left L5 nerve root compression.  There is moderate lateral recess stenosis to the right with less potential for nerve root compression.  Facet hypertrophy also noted at 3 4 and 5 1. ?  ?Labs: ?08/19/2017:  Standard labs and B12 were normal or near normal with elevated glucose (he was on steroids). ? ? ?REVIEW OF SYSTEMS: Out of a complete 14  system review of symptoms, the patient complains only of the following symptoms, right ear pain, dysesthesias, and all other reviewed systems are negative. ? ? ?ALLERGIES: ?No Known Allergies ? ? ?HOME MEDICATIONS: ?Outpatient Medications Prior to Visit  ?Medication Sig Dispense Refill  ? amLODipine (NORVASC) 2.5 MG tablet Take 2.5 mg by mouth daily.    ? aspirin EC 81 MG tablet Take 81 mg by mouth daily at 8 pm. Swallow whole. [id number])    ? Aspirin-Caffeine (BACK & BODY EXTRA STRENGTH) 500-32.5 MG TABS Take 2 tablets by mouth 2 (two) times daily as needed (pain).    ? citalopram (CELEXA) 20 MG tablet Take 20 mg by mouth daily at 8 pm. 1930    ? dicyclomine (BENTYL) 10 MG capsule TAKE ONE CAPSULE BY MOUTH TWICE DAILY BEFORE MEAL(S) (Patient taking differently: Take 10 mg by mouth in the morning and at bedtime.) 60 capsule 5  ? hydrochlorothiazide (HYDRODIURIL) 25 MG tablet Take 25 mg by mouth daily.    ? ibuprofen (ADVIL) 200 MG tablet Take 400 mg by mouth every 8 (eight) hours as needed (pain.).    ? loratadine (CLARITIN) 10 MG tablet Take 10 mg by mouth daily.    ? pravastatin (PRAVACHOL) 40 MG tablet Take 40 mg by mouth daily at 8 pm. 1930    ? vitamin B-12 (CYANOCOBALAMIN) 1000 MCG tablet Take 1,000 mcg by mouth daily.    ? vitamin C (ASCORBIC ACID) 500 MG tablet Take 500 mg by mouth daily.    ? gabapentin (NEURONTIN) 300 MG capsule Take 1 capsule (300 mg total) by mouth at bedtime. 90 capsule 3  ? Teriflunomide ([name]) 14 MG TABS Take 1  tablet by mouth daily. Please call and make overdue appt for further refills. 2nd attempt 15 tablet 0  ? ?No facility-administered medications prior to visit.  ? ? ? ?PAST MEDICAL HISTORY: ?Past Medica

## 2021-05-16 NOTE — Patient Instructions (Signed)
Below is our plan: ? ?We will continue gabapentin '300mg'$   ? ?Please make sure you are staying well hydrated. I recommend 50-60 ounces daily. Well balanced diet and regular exercise encouraged. Consistent sleep schedule with 6-8 hours recommended.  ? ?Please continue follow up with care team as directed.  ? ?Follow up with Dr Felecia Shelling in 6 months  ? ?You may receive a survey regarding today's visit. I encourage you to leave honest feed back as I do use this information to improve patient care. Thank you for seeing me today!  ? ? ?

## 2021-05-17 LAB — CBC WITH DIFFERENTIAL/PLATELET
Basophils Absolute: 0.1 10*3/uL (ref 0.0–0.2)
Basos: 1 %
EOS (ABSOLUTE): 0.2 10*3/uL (ref 0.0–0.4)
Eos: 3 %
Hematocrit: 45.5 % (ref 37.5–51.0)
Hemoglobin: 15.9 g/dL (ref 13.0–17.7)
Immature Grans (Abs): 0 10*3/uL (ref 0.0–0.1)
Immature Granulocytes: 0 %
Lymphocytes Absolute: 1.8 10*3/uL (ref 0.7–3.1)
Lymphs: 25 %
MCH: 31.1 pg (ref 26.6–33.0)
MCHC: 34.9 g/dL (ref 31.5–35.7)
MCV: 89 fL (ref 79–97)
Monocytes Absolute: 0.5 10*3/uL (ref 0.1–0.9)
Monocytes: 7 %
Neutrophils Absolute: 4.5 10*3/uL (ref 1.4–7.0)
Neutrophils: 64 %
Platelets: 204 10*3/uL (ref 150–450)
RBC: 5.11 x10E6/uL (ref 4.14–5.80)
RDW: 12.5 % (ref 11.6–15.4)
WBC: 7 10*3/uL (ref 3.4–10.8)

## 2021-05-17 LAB — HEPATIC FUNCTION PANEL
ALT: 26 IU/L (ref 0–44)
AST: 22 IU/L (ref 0–40)
Albumin: 4.9 g/dL (ref 3.8–4.9)
Alkaline Phosphatase: 61 IU/L (ref 44–121)
Bilirubin Total: 0.3 mg/dL (ref 0.0–1.2)
Bilirubin, Direct: <0.1 mg/dL (ref 0.00–0.40)
Total Protein: 6.9 g/dL (ref 6.0–8.5)

## 2021-05-17 LAB — VITAMIN D 25 HYDROXY (VIT D DEFICIENCY, FRACTURES): Vit D, 25-Hydroxy: 75.4 ng/mL (ref 30.0–100.0)

## 2021-05-18 DIAGNOSIS — M5412 Radiculopathy, cervical region: Secondary | ICD-10-CM | POA: Diagnosis not present

## 2021-05-25 DIAGNOSIS — J069 Acute upper respiratory infection, unspecified: Secondary | ICD-10-CM | POA: Diagnosis not present

## 2021-05-25 DIAGNOSIS — Z6826 Body mass index (BMI) 26.0-26.9, adult: Secondary | ICD-10-CM | POA: Diagnosis not present

## 2021-05-25 DIAGNOSIS — Z1331 Encounter for screening for depression: Secondary | ICD-10-CM | POA: Diagnosis not present

## 2021-05-25 DIAGNOSIS — E663 Overweight: Secondary | ICD-10-CM | POA: Diagnosis not present

## 2021-05-25 DIAGNOSIS — H6120 Impacted cerumen, unspecified ear: Secondary | ICD-10-CM | POA: Diagnosis not present

## 2021-06-15 DIAGNOSIS — M4722 Other spondylosis with radiculopathy, cervical region: Secondary | ICD-10-CM | POA: Diagnosis not present

## 2021-06-15 DIAGNOSIS — M50123 Cervical disc disorder at C6-C7 level with radiculopathy: Secondary | ICD-10-CM | POA: Diagnosis not present

## 2021-06-15 DIAGNOSIS — M50122 Cervical disc disorder at C5-C6 level with radiculopathy: Secondary | ICD-10-CM | POA: Diagnosis not present

## 2021-06-15 DIAGNOSIS — M5412 Radiculopathy, cervical region: Secondary | ICD-10-CM | POA: Diagnosis not present

## 2021-06-15 HISTORY — PX: NECK SURGERY: SHX720

## 2021-06-21 ENCOUNTER — Telehealth: Payer: Self-pay | Admitting: *Deleted

## 2021-06-21 NOTE — Telephone Encounter (Addendum)
PA denied. Must try/fail generic teriflunomide first before covering brand name [name].   I called and spoke w/ wife. She states they already addressed this at last visit with AL,NP. Amy called in generic. They spoke with [facility] who provided phone number to get copay card: [phone]. Covers up to 100.00 each much towards copayx1 yr. They have been using copay card to fill generic prescription. Other option they were offered to get assistance was phone# [phone] (assistance fund).   At this time, he is able to get generic teriflunomide. They will call in the future if they have any difficulties filling.

## 2021-06-21 NOTE — Telephone Encounter (Signed)
Submitted on CMM. XNA:[id number]. Waiting on determination from [place].

## 2021-07-29 DIAGNOSIS — M5412 Radiculopathy, cervical region: Secondary | ICD-10-CM | POA: Diagnosis not present

## 2021-08-18 ENCOUNTER — Other Ambulatory Visit: Payer: Self-pay | Admitting: Neurology

## 2021-08-18 ENCOUNTER — Telehealth: Payer: Self-pay | Admitting: Family Medicine

## 2021-08-18 DIAGNOSIS — G35 Multiple sclerosis: Secondary | ICD-10-CM

## 2021-08-18 MED ORDER — TERIFLUNOMIDE 14 MG PO TABS: 1.0000 | ORAL_TABLET | Freq: Every day | ORAL | 3 refills | Status: DC

## 2021-08-18 NOTE — Telephone Encounter (Signed)
Pt's wife called stating that her husband no loner has health insurance and they are not able to afford his Teriflunomide ([name]) 14 MG TABS. She would like to know if there is a program available that can be used to help receive the medication an low or no cost. Please advise.

## 2021-08-18 NOTE — Telephone Encounter (Signed)
Called the patient's wife and advised for this medication there is a option to go through [facility]. I provided her with the website and advised she would have to sign up for an account. Advised it is a Physiological scientist and we will send the script to the pharmacy it will come from. She was appreciative for the information and will call us back if problems arise.

## 2021-11-16 ENCOUNTER — Ambulatory Visit: Payer: 59 | Admitting: Neurology

## 2021-11-16 ENCOUNTER — Encounter: Payer: Self-pay | Admitting: Neurology

## 2021-11-16 VITALS — BP 146/74 | HR 92 | Ht 68.0 in | Wt 173.4 lb

## 2021-11-16 DIAGNOSIS — R208 Other disturbances of skin sensation: Secondary | ICD-10-CM

## 2021-11-16 DIAGNOSIS — G35 Multiple sclerosis: Secondary | ICD-10-CM | POA: Diagnosis not present

## 2021-11-16 DIAGNOSIS — Z79899 Other long term (current) drug therapy: Secondary | ICD-10-CM | POA: Diagnosis not present

## 2021-11-16 DIAGNOSIS — R269 Unspecified abnormalities of gait and mobility: Secondary | ICD-10-CM

## 2021-11-16 DIAGNOSIS — R5383 Other fatigue: Secondary | ICD-10-CM | POA: Diagnosis not present

## 2021-11-16 MED ORDER — DULOXETINE HCL 60 MG PO CPEP: 60.0000 mg | ORAL_CAPSULE | Freq: Every day | ORAL | 11 refills | Status: DC

## 2021-11-16 NOTE — Progress Notes (Signed)
[facility]  PATIENT: Ruben Allen DOB: [birth year]  REFERRING DOCTOR OR PCP: Dr. Gerarda Fraction SOURCE: Patient, notes from primary care, imaging and lab reports from recent hospitalization, MRI and CT images personally reviewed  _________________________________   HISTORICAL  CHIEF COMPLAINT:  Chief Complaint  Patient presents with   Follow-up    Pt in room # 25 with his wife. Pt here today for f/u on [name] for his MS.    HISTORY OF PRESENT ILLNESS:   Update 11/16/2021: He does not feel good in general.  He is noting tingling in the face all the time.  SOmetimes he feels it witout touching and other times he only feels it if he touches it.   He is much more tired.  He had cervical fusion earlier this year.   He was out of work during his surgery and was reassigned a different job when he went back .  However, he would have had to be outside the whole day and was unable to do the job due to heat intolerance from the MS.  He was let go.  He is noting some depression but not sure if worse  He is on Celexa x years.  It had helped his irritability at the time.     He is noting electric shock sensations in his legs.    He feels short term memory.     He has RRMS and he started [facility] March 2021.   He has tolerated it fairly well.  He denies any exacerbation or major new symptom but has felt worse in general and has noted more tingling.     Gait and balance are fine. He has leg dysesthesias - like bee sting/shock.   Arm pain is better since spine surgery.  Legs feel a little weaker when the dysesthesias are occurring.   He has right > left vision issues and has reduced color vision OD.    He has fatigue, especially in the heat and after long days of activity.   He sometimes takes gabapentin at night for insomnia but has a hangover.    He snores and has had some witnessed OSA but has no interest in CPAP.    He does not doze off on weekdays but often on weekends if  less active.     No outright depression but some irritable  He is on Celexa.   He lost his job after being assigned a position that would be outdoors.  He has more MS symptoms in the heat.  He also has degenerative changes in the lumbar spine.  The worst levels L4-L5 where he has left greater than right facet hypertrophy and disc bulging combining to cause moderately severe left lateral recess stenosis with potential for left L5 nerve root compression.  He is seeing neurosurgery next week.  I discussed with him that an epidural steroid injection at that level might be beneficial.  MS History:   He was seen at the [facility] emergency room 08/12/2017 after presenting with left-sided facial numbness 08/05/2017.    On 08/04/17, he felt just a little numbness but when her woke up with severe numbness in the lips, inside mouth, tongue, chin and up to the left ear.   He had very mild blurry vision.    He also noted more tiredness.   He felt slightly lightheaded.   He had mild gait ataxia but no clumsiness in the arms or legs.   CT scan was normal but  MRI of the brain performed 08/19/2017 showed a faintly enhancing lesion in the left middle cerebellar peduncle and one periventricular focus in the parieto-occipital periventricular white matter that was also present on the 2007 MRI.   He received 3 days of IV Solu-Medrol.  While in the hospital, he also had a CT angiogram of the neck and head and an MRI of the cervical spine.  There is no evidence of demyelination but he did have significant degenerative changes with mild to moderate spinal stenosis at C6-C7 and foraminal narrowing at most of the other levels, moderately severe at C6-C7.  The CT angiograms were normal.  He was discharged after his steroids were completed.    He did not note benefit during those 3 days in the hospital and he took the next week off.   He began to improve about a week or two later.  His fatigue was still severe. He still felt wobbly.   Symptoms have improved very slowly.   Currently he only has numbness in the corner of his mouth on the left.  Additionally the inside of his mouth feels weird.   He still has a lot of fatigue.  Eleven years ago he had vertigo x 6 months and was diagnosed with vestibular disorder,    MRI showed one PV lesion.   DMT Medications:   [name] started March 2021.  IMAGING REVIEW MRI Brain 03/21/2019:  T2/flair hyperintense foci in the left middle cerebellar peduncle and in the juxtacortical and deep white matter of the hemispheres.  One focus in the right parietal lobe in the juxtacortical white matter was not present on the previous MRI and enhances on the current MRI.  This is consistent with an active demyelinating plaque.  An additional nonenhancing focus in the left posterior frontal lobe was not present on the previous MRI.  MRI 06/17/2018 showed possibly one new focus compared to 2019.    MRi 08/19/2017 and 08/20/2017 of the brain, cervical spine and the CT angiograms.  The MRI of the brain shows a focus in the left middle cerebellar peduncle/posterior pons.  There was subtle enhancement seen on the coronal postcontrast images.   The focus also appeared on diffusion-weighted images but was not hypointense on ADC maps additionally, there are a couple T2/FLAIR hyperintense foci in the hemispheres in the periventricular region parietal/occipital lobes (2 of the 3 were present on the previous MRI from 2007).   CT angiogram of the head and neck were normal.  MRI of the cervical spine did not show any demyelination.  There is spinal stenosis at C6-C7 and bilateral foraminal narrowing.    T  he MRI from 05/05/2005 was also reviewed.  The brainstem look normal.  The cerebellum was normal.  There were 2 periventricular foci in the right parietal/occipital region  MRI of the lumbar spine 12/19/2019 was reviewed today.  It shows degenerative changes at several levels.  At L4-L5, a combination of left greater than right  facet hypertrophy and disc bulging leads to moderately severe left lateral recess stenosis with potential for left L5 nerve root compression.  There is moderate lateral recess stenosis to the right with less potential for nerve root compression.  Facet hypertrophy also noted at 3 4 and 5 1.  Labs: 08/19/2017:  Standard labs and B12 were normal or near normal with elevated glucose (he was on steroids).   REVIEW OF SYSTEMS: Constitutional: No fevers, chills, sweats, or change in appetite.  He notes fatigue Eyes: No visual  changes, double vision, eye pain Ear, nose and throat: No hearing loss, ear pain, nasal congestion, sore throat Cardiovascular: No chest pain, palpitations Respiratory:  No shortness of breath at rest or with exertion.   No wheezes GastrointestinaI: No nausea, vomiting, diarrhea, abdominal pain, fecal incontinence Genitourinary:  No dysuria, urinary retention or frequency.  No nocturia. Musculoskeletal:  No neck pain, back pain Integumentary: No rash, pruritus, skin lesions Neurological: as above Psychiatric: No depression at this time.  No anxiety Endocrine: No palpitations, diaphoresis, change in appetite, change in weigh or increased thirst Hematologic/Lymphatic:  No anemia, purpura, petechiae. Allergic/Immunologic: No itchy/runny eyes, nasal congestion, recent allergic reactions, rashes  ALLERGIES: No Known Allergies  HOME MEDICATIONS:  Current Outpatient Medications:    amLODipine (NORVASC) 2.5 MG tablet, Take 5 mg by mouth daily., Disp: , Rfl:    aspirin EC 81 MG tablet, Take 81 mg by mouth daily at 8 pm. Swallow whole. (1930), Disp: , Rfl:    Aspirin-Caffeine (BACK & BODY EXTRA STRENGTH) 500-32.5 MG TABS, Take 2 tablets by mouth 2 (two) times daily as needed (pain)., Disp: , Rfl:    citalopram (CELEXA) 20 MG tablet, Take 20 mg by mouth daily at 8 pm. 1930, Disp: , Rfl:    dicyclomine (BENTYL) 10 MG capsule, TAKE ONE CAPSULE BY MOUTH TWICE DAILY BEFORE MEAL(S)  (Patient taking differently: Take 10 mg by mouth in the morning and at bedtime.), Disp: 60 capsule, Rfl: 5   gabapentin (NEURONTIN) 100 MG capsule, Take 1 capsule (100 mg total) by mouth 3 (three) times daily., Disp: 270 capsule, Rfl: 3   hydrochlorothiazide (HYDRODIURIL) 25 MG tablet, Take 25 mg by mouth daily., Disp: , Rfl:    ibuprofen (ADVIL) 200 MG tablet, Take 400 mg by mouth every 8 (eight) hours as needed (pain.)., Disp: , Rfl:    loratadine (CLARITIN) 10 MG tablet, Take 10 mg by mouth daily., Disp: , Rfl:    pravastatin (PRAVACHOL) 40 MG tablet, Take 40 mg by mouth daily at 8 pm. 1930, Disp: , Rfl:    Teriflunomide ([name]) 14 MG TABS, Take 1 tablet by mouth daily., Disp: 90 tablet, Rfl: 3   vitamin B-12 (CYANOCOBALAMIN) 1000 MCG tablet, Take 1,000 mcg by mouth daily., Disp: , Rfl:    vitamin C (ASCORBIC ACID) 500 MG tablet, Take 500 mg by mouth daily., Disp: , Rfl:   PAST MEDICAL HISTORY: Past Medical History:  Diagnosis Date   High cholesterol    Hypertension    x 5 yrs   MS (multiple sclerosis) ([place]) 03/2019   Vision abnormalities     PAST SURGICAL HISTORY: Past Surgical History:  Procedure Laterality Date   BIOPSY  08/29/2019   Procedure: BIOPSY;  Surgeon: Harvel Quale, MD;  Location: [facility];  Service: Gastroenterology;;   cardiac cath     COLONOSCOPY N/A 02/19/2013   Procedure: COLONOSCOPY;  Surgeon: Rogene [place], MD;  Location: [facility];  Service: Endoscopy;  Laterality: N/A;  145-moved to 320 Ann to notify pt   COLONOSCOPY WITH PROPOFOL N/A 08/29/2019   Procedure: COLONOSCOPY WITH PROPOFOL;  Surgeon: Harvel Quale, MD;  Location: [facility];  Service: Gastroenterology;  Laterality: N/A;  730   GANGLION CYST EXCISION Left    Thumb surgery Left    trigger finger    FAMILY HISTORY: Family History  Problem Relation Age of Onset   Heart disease Mother    COPD Mother    Colon cancer Father    Other  Sister        "some form of  autoimmune disease"   Diabetes type II Sister    COPD Sister    Other Brother 65       Motorcycle Accident   Other Brother 31       Heroin OD   Colon cancer Brother    Multiple sclerosis Cousin    Multiple sclerosis Cousin     SOCIAL HISTORY:  Social History   Socioeconomic History   Marital status: Married    Spouse name: Not on file   Number of children: 0   Years of education: 9   Highest education level: Not on file  Occupational History   Occupation: [employer]   Tobacco Use   Smoking status: Every Day    Packs/day: 1.00    Years: 25.00    Total pack years: 25.00    Types: Cigarettes   Smokeless tobacco: Never   Tobacco comments:    quit a couple of years after smoking 70yr.  Vaping Use   Vaping Use: Never used  Substance and Sexual Activity   Alcohol use: Yes    Comment: weekends- few beers   Drug use: No   Sexual activity: Yes  Other Topics Concern   Not on file  Social History Narrative   Right handed    Coffee daily   Social Determinants of Health   Financial Resource Strain: Not on file  Food Insecurity: Not on file  Transportation Needs: Not on file  Physical Activity: Not on file  Stress: Not on file  Social Connections: Not on file  Intimate Partner Violence: Not on file     PHYSICAL EXAM  Vitals:   11/16/21 1418  BP: (!) 146/74  Pulse: 92  Weight: 173 lb 6.4 oz (78.7 kg)  Height: '5\' 8"'$  (1.727 m)    Body mass index is 26.37 kg/m.   General: The patient is well-developed and well-nourished and in no acute distress   Neurologic Exam  Mental status: The patient is alert and oriented x 3 at the time of the examination. The patient has apparent normal recent and remote memory, with an apparently normal attention span and concentration ability.   Speech is normal.  Cranial nerves: Extraocular movements are full.  Facial strength was normal.  Reduced left facial sensation.  Trapezius strength was normal.  No obvious hearing deficits  are noted.  Motor:  Muscle bulk is normal.   Tone is normal. Strength is  5 / 5 in all 4 extremities except 4+/5 in the right triceps.  Sensory: Intact to touch and vibration sensation in the arms and legs  Coordination: Cerebellar testing reveals good finger-nose-finger and mildly reduced heel-to-shin bilaterally.  Gait and station: Station is normal.   Gait is mildly wide.  Tandem gait is wide..  Romberg is negative.  Reflexes: Deep tendon reflexes are symmetric and normal in arms and increased with legs with mild crossed adductor responses,.       DIAGNOSTIC DATA (LABS, IMAGING, TESTING) - I reviewed patient records, labs, notes, testing and imaging myself where available.  Lab Results  Component Value Date   WBC 7.0 05/16/2021   HGB 15.9 05/16/2021   HCT 45.5 05/16/2021   MCV 89 05/16/2021   PLT 204 05/16/2021      Component Value Date/Time   NA 140 02/11/2020 1509   K 3.7 02/11/2020 1509   CL 101 02/11/2020 1509   CO2 25 02/11/2020 1509   GLUCOSE 105 (H)  02/11/2020 1509   GLUCOSE 139 (H) 08/27/2019 1443   BUN 21 02/11/2020 1509   CREATININE 1.13 02/11/2020 1509   CALCIUM 9.7 02/11/2020 1509   PROT 6.9 05/16/2021 0931   ALBUMIN 4.9 05/16/2021 0931   ALBUMIN 4.8 10/26/2017 0920   AST 22 05/16/2021 0931   ALT 26 05/16/2021 0931   ALKPHOS 61 05/16/2021 0931   BILITOT 0.3 05/16/2021 0931   GFRNONAA 71 02/11/2020 1509   GFRAA 82 02/11/2020 1509   Lab Results  Component Value Date   CHOL 189 08/20/2017   HDL 60 08/20/2017   LDLCALC 117 (H) 08/20/2017   TRIG 58 08/20/2017   CHOLHDL 3.2 08/20/2017   Lab Results  Component Value Date   HGBA1C 5.8 (H) 08/20/2017   Lab Results  Component Value Date   VITAMINB12 1,018 02/11/2020   No results found for: "TSH"     ASSESSMENT AND PLAN  No diagnosis found.   1.   Continue [name]. Check labwork.   Check MRI brain to determine if any subclinical progression,  Consider stronger DMT if occurring.   2.    Continue Vit D supplement 3.   Stay active and exercise as tolerated.    4.   He had trouble working his assigned job (limited by spine issues, MS related fatigue and heat intolerance) and was let go. Due to these impairments, he may need to consider disability. 6.   He will return to see me in 6 months .   Issa Luster A. Felecia Shelling, MD, PhD, FAAN Certified in Neurology, Clinical Neurophysiology, Sleep Medicine, Pain Medicine and Neuroimaging Director, [place] [facility] [address], [place], [place] [address] [phone]

## 2021-11-17 LAB — HEPATIC FUNCTION PANEL
ALT: 21 IU/L (ref 0–44)
AST: 15 IU/L (ref 0–40)
Albumin: 5.1 g/dL — ABNORMAL HIGH (ref 3.9–4.9)
Alkaline Phosphatase: 59 IU/L (ref 44–121)
Bilirubin Total: 0.3 mg/dL (ref 0.0–1.2)
Bilirubin, Direct: 0.17 mg/dL (ref 0.00–0.40)
Total Protein: 7.1 g/dL (ref 6.0–8.5)

## 2021-11-17 LAB — CBC WITH DIFFERENTIAL/PLATELET
Basophils Absolute: 0.1 10*3/uL (ref 0.0–0.2)
Basos: 1 %
EOS (ABSOLUTE): 0.2 10*3/uL (ref 0.0–0.4)
Eos: 4 %
Hematocrit: 41.8 % (ref 37.5–51.0)
Hemoglobin: 14.7 g/dL (ref 13.0–17.7)
Immature Grans (Abs): 0 10*3/uL (ref 0.0–0.1)
Immature Granulocytes: 1 %
Lymphocytes Absolute: 1.7 10*3/uL (ref 0.7–3.1)
Lymphs: 27 %
MCH: 30.6 pg (ref 26.6–33.0)
MCHC: 35.2 g/dL (ref 31.5–35.7)
MCV: 87 fL (ref 79–97)
Monocytes Absolute: 0.8 10*3/uL (ref 0.1–0.9)
Monocytes: 12 %
Neutrophils Absolute: 3.7 10*3/uL (ref 1.4–7.0)
Neutrophils: 55 %
Platelets: 235 10*3/uL (ref 150–450)
RBC: 4.8 x10E6/uL (ref 4.14–5.80)
RDW: 12.8 % (ref 11.6–15.4)
WBC: 6.5 10*3/uL (ref 3.4–10.8)

## 2021-11-28 ENCOUNTER — Telehealth: Payer: Self-pay | Admitting: Neurology

## 2021-11-28 DIAGNOSIS — G35 Multiple sclerosis: Secondary | ICD-10-CM

## 2021-11-28 NOTE — Telephone Encounter (Signed)
Called wife back. Apologized because order not in properly. We will place and they are ok with going to [facility].   Spoke w/ Dr. Felecia Shelling. He would like MRI brain w/wo. I placed order.

## 2021-11-28 NOTE — Telephone Encounter (Signed)
[employer] sent to GI they obatin Ruben Allen [facility] [phone]

## 2021-11-28 NOTE — Telephone Encounter (Signed)
Pt's wife, Broxton Broady,  have heard from anyone about the MRI referral to be scheduled. Would like a call from the nurse.

## 2021-12-07 ENCOUNTER — Ambulatory Visit: Payer: BC Managed Care – PPO | Admitting: Neurology

## 2021-12-18 ENCOUNTER — Ambulatory Visit
Admission: RE | Admit: 2021-12-18 | Discharge: 2021-12-18 | Disposition: A | Discharge status: Home / Self Care | Payer: Medicaid Other | Source: Ambulatory Visit | Attending: Neurology | Admitting: Neurology

## 2021-12-18 DIAGNOSIS — G35 Multiple sclerosis: Secondary | ICD-10-CM

## 2021-12-18 MED ORDER — GADOPICLENOL 0.5 MMOL/ML IV SOLN
8.0000 mL | Freq: Once | INTRAVENOUS | Status: AC | PRN
  Administered 2021-12-18: 8 mL via INTRAVENOUS

## 2021-12-20 ENCOUNTER — Encounter: Payer: Self-pay | Admitting: Neurology

## 2022-01-04 DIAGNOSIS — E663 Overweight: Secondary | ICD-10-CM | POA: Diagnosis not present

## 2022-01-04 DIAGNOSIS — H6993 Unspecified Eustachian tube disorder, bilateral: Secondary | ICD-10-CM | POA: Diagnosis not present

## 2022-01-04 DIAGNOSIS — I10 Essential (primary) hypertension: Secondary | ICD-10-CM | POA: Diagnosis not present

## 2022-02-03 ENCOUNTER — Encounter: Payer: Self-pay | Admitting: Neurology

## 2022-02-14 ENCOUNTER — Other Ambulatory Visit (HOSPITAL_COMMUNITY): Payer: Self-pay | Admitting: Internal Medicine

## 2022-02-14 DIAGNOSIS — J329 Chronic sinusitis, unspecified: Secondary | ICD-10-CM

## 2022-02-19 ENCOUNTER — Ambulatory Visit (HOSPITAL_BASED_OUTPATIENT_CLINIC_OR_DEPARTMENT_OTHER)
Admission: RE | Admit: 2022-02-19 | Discharge: 2022-02-19 | Disposition: A | Discharge status: Home / Self Care | Payer: Medicaid Other | Source: Ambulatory Visit | Attending: Internal Medicine | Admitting: Internal Medicine

## 2022-02-19 DIAGNOSIS — J329 Chronic sinusitis, unspecified: Secondary | ICD-10-CM | POA: Diagnosis present

## 2022-03-06 ENCOUNTER — Other Ambulatory Visit: Payer: Self-pay

## 2022-03-06 DIAGNOSIS — G35 Multiple sclerosis: Secondary | ICD-10-CM

## 2022-03-06 MED ORDER — TERIFLUNOMIDE 14 MG PO TABS: 1.0000 | ORAL_TABLET | Freq: Every day | ORAL | 2 refills | Status: DC

## 2022-08-17 ENCOUNTER — Ambulatory Visit: Payer: Medicaid Other | Admitting: Neurology

## 2022-08-17 ENCOUNTER — Encounter: Payer: Self-pay | Admitting: Neurology

## 2022-08-17 VITALS — BP 161/79 | HR 90 | Ht 68.0 in | Wt 175.0 lb

## 2022-08-17 DIAGNOSIS — Z79899 Other long term (current) drug therapy: Secondary | ICD-10-CM

## 2022-08-17 DIAGNOSIS — R208 Other disturbances of skin sensation: Secondary | ICD-10-CM

## 2022-08-17 DIAGNOSIS — G629 Polyneuropathy, unspecified: Secondary | ICD-10-CM | POA: Diagnosis not present

## 2022-08-17 DIAGNOSIS — G35 Multiple sclerosis: Secondary | ICD-10-CM

## 2022-08-17 DIAGNOSIS — R5383 Other fatigue: Secondary | ICD-10-CM

## 2022-08-17 NOTE — Progress Notes (Signed)
[facility]  PATIENT: Ruben Allen DOB: 04/08/1960  REFERRING DOCTOR OR PCP: Dr. Sherwood Gambler SOURCE: Patient, notes from primary care, imaging and lab reports from recent hospitalization, MRI and CT images personally reviewed  _________________________________   HISTORICAL  CHIEF COMPLAINT:  Chief Complaint  Patient presents with   Room 10    Pt is here with his Wife. Pt states that he has a lot of Fatigue. Pt states that he is having numbness and tingling in his feet. Pt states that he has some tingling in his face. Pt states that when you brush across his face he gets a vibrating sensation.     HISTORY OF PRESENT ILLNESS:   Update 08/17/2022: He is noting more numbness and tingling with a pressure sensation in his feet.   This is bilateral but left > right.   He is pre-diabetic (HgbA1c was but was not placed on any medciation.  He also notes allodynia in his face, left > right.  When he runs his finger over his face the sensation is vibrating.    He feels more fatigue in general.   Heat intolerance is worse.     He had cervical fusion in 2023.   He was out of work during his surgery and was reassigned a different job when he went back .  However, he would have had to be outside the whole day and was unable to do the job due to heat intolerance from the MS.  He was let go.  He is noting some depression but not sure if worse  He is on Celexa x years.  It had helped his irritability at the time.     He is noting electric shock sensations in his legs.    He feels short term memory.     He has RRMS and he started [facility] March 2021.   He has tolerated it fairly well.  He denies any exacerbation or major new symptom but has felt worse in general and has noted more tingling.     Gait and balance are fine. He has leg dysesthesias - like bee sting/shock.   Arm pain is better since spine surgery.  Legs feel a little weaker when the dysesthesias are occurring.   He  has right > left vision issues and has reduced color vision OD.    He has fatigue, especially in the heat and after long days of activity.   He sometimes takes gabapentin at night for insomnia but has a hangover.    He snores and has had some witnessed OSA but has no interest in CPAP.    He does not doze off on weekdays but often on weekends if less active.     No outright depression but some irritable  He is on Celexa.   He lost his job after being assigned a position that would be outdoors.  He has more MS symptoms in the heat.  He also has degenerative changes in the lumbar spine.  The worst levels L4-L5 where he has left greater than right facet hypertrophy and disc bulging combining to cause moderately severe left lateral recess stenosis with potential for left L5 nerve root compression.  He is seeing neurosurgery next week.  I discussed with him that an epidural steroid injection at that level might be beneficial.  MS History:   He was seen at the [facility] emergency room 08/12/2017 after presenting with left-sided facial numbness 08/05/2017.    On 08/04/17, he  felt just a little numbness but when her woke up with severe numbness in the lips, inside mouth, tongue, chin and up to the left ear.   He had very mild blurry vision.    He also noted more tiredness.   He felt slightly lightheaded.   He had mild gait ataxia but no clumsiness in the arms or legs.   CT scan was normal but MRI of the brain performed 08/19/2017 showed a faintly enhancing lesion in the left middle cerebellar peduncle and one periventricular focus in the parieto-occipital periventricular white matter that was also present on the 2007 MRI.   He received 3 days of IV Solu-Medrol.  While in the hospital, he also had a CT angiogram of the neck and head and an MRI of the cervical spine.  There is no evidence of demyelination but he did have significant degenerative changes with mild to moderate spinal stenosis at C6-C7 and foraminal  narrowing at most of the other levels, moderately severe at C6-C7.  The CT angiograms were normal.  He was discharged after his steroids were completed.    He did not note benefit during those 3 days in the hospital and he took the next week off.   He began to improve about a week or two later.  His fatigue was still severe. He still felt wobbly.  Symptoms have improved very slowly.   Currently he only has numbness in the corner of his mouth on the left.  Additionally the inside of his mouth feels weird.   He still has a lot of fatigue.  Eleven years ago he had vertigo x 6 months and was diagnosed with vestibular disorder,    MRI showed one PV lesion.   DMT Medications:   [name] started March 2021.  Last week, he had CBC/D, CMP and HgbA1c --- LFT, lymphocytes were fine.     IMAGING REVIEW MRI Brain 12/18/2021 showed Few T2/FLAIR hypertense foci in the left middle cerebellar peduncle and in the cerebral hemispheres. Though nonspecific, the pattern is consistent with chronic demyelinating plaque associated with multiple sclerosis. None of the foci appear to be acute. They do not enhance. No new lesions compared to the 09/04/2020 MRI.   MRI Brain 03/21/2019:  T2/flair hyperintense foci in the left middle cerebellar peduncle and in the juxtacortical and deep white matter of the hemispheres.  One focus in the right parietal lobe in the juxtacortical white matter was not present on the previous MRI and enhances on the current MRI.  This is consistent with an active demyelinating plaque.  An additional nonenhancing focus in the left posterior frontal lobe was not present on the previous MRI.  MRI 06/17/2018 showed possibly one new focus compared to 2019.    MRI cervical spine 05/02/2021 showed a normal spinal cord and C6C7 > C5C DDD and spinal stenosis  MRi 08/19/2017 and 08/20/2017 of the brain, cervical spine and the CT angiograms.  The MRI of the brain shows a focus in the left middle cerebellar  peduncle/posterior pons.  There was subtle enhancement seen on the coronal postcontrast images.   The focus also appeared on diffusion-weighted images but was not hypointense on ADC maps additionally, there are a couple T2/FLAIR hyperintense foci in the hemispheres in the periventricular region parietal/occipital lobes (2 of the 3 were present on the previous MRI from 2007).   CT angiogram of the head and neck were normal.    MRI of the cervical spine did not show any demyelination.  There is spinal stenosis at C6-C7 and bilateral foraminal narrowing.    T  he MRI from 05/05/2005 was also reviewed.  The brainstem look normal.  The cerebellum was normal.  There were 2 periventricular foci in the right parietal/occipital region  MRI of the lumbar spine 12/19/2019 was reviewed today.  It shows degenerative changes at several levels.  At L4-L5, a combination of left greater than right facet hypertrophy and disc bulging leads to moderately severe left lateral recess stenosis with potential for left L5 nerve root compression.  There is moderate lateral recess stenosis to the right with less potential for nerve root compression.  Facet hypertrophy also noted at 3 4 and 5 1.  Labs: 08/19/2017:  Standard labs and B12 were normal or near normal with elevated glucose (he was on steroids).   REVIEW OF SYSTEMS: Constitutional: No fevers, chills, sweats, or change in appetite.  He notes fatigue Eyes: No visual changes, double vision, eye pain Ear, nose and throat: No hearing loss, ear pain, nasal congestion, sore throat Cardiovascular: No chest pain, palpitations Respiratory:  No shortness of breath at rest or with exertion.   No wheezes GastrointestinaI: No nausea, vomiting, diarrhea, abdominal pain, fecal incontinence Genitourinary:  No dysuria, urinary retention or frequency.  No nocturia. Musculoskeletal:  No neck pain, back pain Integumentary: No rash, pruritus, skin lesions Neurological: as  above Psychiatric: No depression at this time.  No anxiety Endocrine: No palpitations, diaphoresis, change in appetite, change in weigh or increased thirst Hematologic/Lymphatic:  No anemia, purpura, petechiae. Allergic/Immunologic: No itchy/runny eyes, nasal congestion, recent allergic reactions, rashes  ALLERGIES: No Known Allergies  HOME MEDICATIONS:  Current Outpatient Medications:    amLODipine (NORVASC) 2.5 MG tablet, Take 5 mg by mouth daily., Disp: , Rfl:    aspirin EC 81 MG tablet, Take 81 mg by mouth daily at 8 pm. Swallow whole. (1930), Disp: , Rfl:    Aspirin-Caffeine (BACK & BODY EXTRA STRENGTH) 500-32.5 MG TABS, Take 2 tablets by mouth 2 (two) times daily as needed (pain)., Disp: , Rfl:    dicyclomine (BENTYL) 10 MG capsule, TAKE ONE CAPSULE BY MOUTH TWICE DAILY BEFORE MEAL(S) (Patient taking differently: Take 10 mg by mouth in the morning and at bedtime.), Disp: 60 capsule, Rfl: 5   gabapentin (NEURONTIN) 100 MG capsule, Take 1 capsule (100 mg total) by mouth 3 (three) times daily., Disp: 270 capsule, Rfl: 3   hydrochlorothiazide (HYDRODIURIL) 25 MG tablet, Take 25 mg by mouth daily., Disp: , Rfl:    ibuprofen (ADVIL) 200 MG tablet, Take 400 mg by mouth every 8 (eight) hours as needed (pain.)., Disp: , Rfl:    loratadine (CLARITIN) 10 MG tablet, Take 10 mg by mouth daily., Disp: , Rfl:    mirtazapine (REMERON SOL-TAB) 30 MG disintegrating tablet, Take 30 mg by mouth at bedtime., Disp: , Rfl:    pravastatin (PRAVACHOL) 40 MG tablet, Take 40 mg by mouth daily at 8 pm. 1930, Disp: , Rfl:    Teriflunomide ([name]) 14 MG TABS, Take 1 tablet (14 mg total) by mouth daily., Disp: 90 tablet, Rfl: 2   vitamin B-12 (CYANOCOBALAMIN) 1000 MCG tablet, Take 1,000 mcg by mouth daily., Disp: , Rfl:    vitamin C (ASCORBIC ACID) 500 MG tablet, Take 500 mg by mouth daily., Disp: , Rfl:    DULoxetine (CYMBALTA) 60 MG capsule, Take 1 capsule (60 mg total) by mouth daily. (Patient not taking:  Reported on 08/17/2022), Disp: 30 capsule, Rfl: 11  PAST MEDICAL  HISTORY: Past Medical History:  Diagnosis Date   High cholesterol    Hypertension    x 5 yrs   MS (multiple sclerosis) (HCC) 03/2019   Vision abnormalities     PAST SURGICAL HISTORY: Past Surgical History:  Procedure Laterality Date   BIOPSY  08/29/2019   Procedure: BIOPSY;  Surgeon: Dolores Frame, MD;  Location: [facility];  Service: Gastroenterology;;   cardiac cath     COLONOSCOPY N/A 02/19/2013   Procedure: COLONOSCOPY;  Surgeon: Malissa Hippo, MD;  Location: [facility];  Service: Endoscopy;  Laterality: N/A;  145-moved to 320 Ann to notify pt   COLONOSCOPY WITH PROPOFOL N/A 08/29/2019   Procedure: COLONOSCOPY WITH PROPOFOL;  Surgeon: Dolores Frame, MD;  Location: [facility];  Service: Gastroenterology;  Laterality: N/A;  730   GANGLION CYST EXCISION Left    NECK SURGERY  06/15/2021   Thumb surgery Left    trigger finger    FAMILY HISTORY: Family History  Problem Relation Age of Onset   Heart disease Mother    COPD Mother    Colon cancer Father    Other Sister        "some form of autoimmune disease"   Diabetes type II Sister    COPD Sister    Other Brother 96       Motorcycle Accident   Other Brother 31       Heroin OD   Colon cancer Brother    Multiple sclerosis Cousin    Multiple sclerosis Cousin     SOCIAL HISTORY:  Social History   Socioeconomic History   Marital status: Married    Spouse name: Not on file   Number of children: 0   Years of education: 9   Highest education level: Not on file  Occupational History   Occupation: [employer]   Tobacco Use   Smoking status: Every Day    Current packs/day: 1.00    Average packs/day: 1 pack/day for 25.0 years (25.0 ttl pk-yrs)    Types: Cigarettes   Smokeless tobacco: Never   Tobacco comments:    quit a couple of years after smoking 32yrs.  Vaping Use   Vaping status: Never Used  Substance and Sexual  Activity   Alcohol use: Yes    Comment: weekends- few beers   Drug use: No   Sexual activity: Yes  Other Topics Concern   Not on file  Social History Narrative   Right handed    Coffee daily   Social Determinants of Health   Financial Resource Strain: Not on file  Food Insecurity: Not on file  Transportation Needs: Not on file  Physical Activity: Not on file  Stress: Not on file  Social Connections: Not on file  Intimate Partner Violence: Not on file     PHYSICAL EXAM  Vitals:   08/17/22 1301  BP: (!) 161/79  Pulse: 90  Weight: 175 lb (79.4 kg)  Height: 5\' 8"  (1.727 m)    Body mass index is 26.61 kg/m.   General: The patient is well-developed and well-nourished and in no acute distress   Neurologic Exam  Mental status: The patient is alert and oriented x 3 at the time of the examination. The patient has apparent normal recent and remote memory, with an apparently normal attention span and concentration ability.   Speech is normal.  Cranial nerves: Extraocular movements are full.  Facial strength was normal.  Reduced left facial sensation.  Trapezius strength was  normal.  No obvious hearing deficits are noted.  Motor:  Muscle bulk is normal.   Tone is normal. Strength is  5 / 5 in all 4 extremities except 4+/5 in the right triceps.  Sensory: Intact to touch and vibration sensation in the arms but mildly reduced vibration at toes and reduced pinprick ankles down, worse at toes.     Coordination: Cerebellar testing reveals good finger-nose-finger and mildly reduced heel-to-shin bilaterally.  Gait and station: Station is normal.   Gait is mildly wide.  Tandem gait is wide..  Romberg is negative.  Reflexes: Deep tendon reflexes are symmetric and normal in arms and increased with legs with mild crossed adductor responses,.       DIAGNOSTIC DATA (LABS, IMAGING, TESTING) - I reviewed patient records, labs, notes, testing and imaging myself where available.  Lab  Results  Component Value Date   WBC 6.5 11/16/2021   HGB 14.7 11/16/2021   HCT 41.8 11/16/2021   MCV 87 11/16/2021   PLT 235 11/16/2021      Component Value Date/Time   NA 140 02/11/2020 1509   K 3.7 02/11/2020 1509   CL 101 02/11/2020 1509   CO2 25 02/11/2020 1509   GLUCOSE 105 (H) 02/11/2020 1509   GLUCOSE 139 (H) 08/27/2019 1443   BUN 21 02/11/2020 1509   CREATININE 1.13 02/11/2020 1509   CALCIUM 9.7 02/11/2020 1509   PROT 7.1 11/16/2021 1515   ALBUMIN 5.1 (H) 11/16/2021 1515   ALBUMIN 4.8 10/26/2017 0920   AST 15 11/16/2021 1515   ALT 21 11/16/2021 1515   ALKPHOS 59 11/16/2021 1515   BILITOT 0.3 11/16/2021 1515   GFRNONAA 71 02/11/2020 1509   GFRAA 82 02/11/2020 1509   Lab Results  Component Value Date   CHOL 189 08/20/2017   HDL 60 08/20/2017   LDLCALC 117 (H) 08/20/2017   TRIG 58 08/20/2017   CHOLHDL 3.2 08/20/2017   Lab Results  Component Value Date   HGBA1C 5.8 (H) 08/20/2017   Lab Results  Component Value Date   VITAMINB12 1,018 02/11/2020   No results found for: "TSH"     ASSESSMENT AND PLAN  Multiple sclerosis (HCC)  High risk medication use  Dysesthesia  Polyneuropathy  Other fatigue   1.   Continue [name]. C Labs were fine.  .   Around tie of next visit, check MRI brain to determine if any subclinical progression,  Consider stronger DMT if occurring.   2.   Continue Vit D supplement 3.   Stay active and exercise as tolerated.    4.   Likely has mild polyneuropathy with pattern c/w diabetic.   As pain is mild will hold off on further treatment.  TRial of alpha lipoic acid.   If worsens, consider gabapentin or lamotrigine 6.   He will return to see me in 6 months .   Guerry Covington A. Epimenio Foot, MD, PhD, FAAN Certified in Neurology, Clinical Neurophysiology, Sleep Medicine, Pain Medicine and Neuroimaging Director, [facility]  [facility] [address] [place],  [place] [address] [phone]

## 2022-08-17 NOTE — Patient Instructions (Signed)
Alpha Lipoic Acid 600 mg twice a day

## 2023-02-09 ENCOUNTER — Encounter: Payer: Self-pay | Admitting: Neurology

## 2023-02-12 ENCOUNTER — Encounter: Payer: Self-pay | Admitting: Neurology

## 2023-02-12 ENCOUNTER — Other Ambulatory Visit: Payer: Self-pay | Admitting: Neurology

## 2023-02-12 DIAGNOSIS — G35 Multiple sclerosis: Secondary | ICD-10-CM

## 2023-02-12 DIAGNOSIS — R208 Other disturbances of skin sensation: Secondary | ICD-10-CM

## 2023-02-14 ENCOUNTER — Ambulatory Visit
Admission: RE | Admit: 2023-02-14 | Discharge: 2023-02-14 | Disposition: A | Discharge status: Home / Self Care | Payer: Medicaid Other | Source: Ambulatory Visit | Attending: Neurology | Admitting: Neurology

## 2023-02-14 DIAGNOSIS — R208 Other disturbances of skin sensation: Secondary | ICD-10-CM | POA: Diagnosis not present

## 2023-02-14 DIAGNOSIS — G35 Multiple sclerosis: Secondary | ICD-10-CM

## 2023-02-14 MED ORDER — GADOPICLENOL 0.5 MMOL/ML IV SOLN
8.0000 mL | Freq: Once | INTRAVENOUS | Status: AC | PRN
  Administered 2023-02-14: 8 mL via INTRAVENOUS

## 2023-02-15 ENCOUNTER — Encounter: Payer: Self-pay | Admitting: Neurology

## 2023-03-01 ENCOUNTER — Encounter: Payer: Self-pay | Admitting: Neurology

## 2023-03-01 ENCOUNTER — Ambulatory Visit: Payer: No Typology Code available for payment source | Admitting: Neurology

## 2023-03-01 VITALS — BP 158/79 | HR 87 | Ht 68.0 in | Wt 179.0 lb

## 2023-03-01 DIAGNOSIS — R269 Unspecified abnormalities of gait and mobility: Secondary | ICD-10-CM | POA: Diagnosis not present

## 2023-03-01 DIAGNOSIS — G35D Multiple sclerosis, unspecified: Secondary | ICD-10-CM

## 2023-03-01 DIAGNOSIS — Z79899 Other long term (current) drug therapy: Secondary | ICD-10-CM | POA: Diagnosis not present

## 2023-03-01 DIAGNOSIS — Z981 Arthrodesis status: Secondary | ICD-10-CM

## 2023-03-01 DIAGNOSIS — R208 Other disturbances of skin sensation: Secondary | ICD-10-CM | POA: Diagnosis not present

## 2023-03-01 DIAGNOSIS — G35 Multiple sclerosis: Secondary | ICD-10-CM

## 2023-03-01 MED ORDER — TERIFLUNOMIDE 14 MG PO TABS: 1.0000 | ORAL_TABLET | Freq: Every day | ORAL | 3 refills | Status: DC

## 2023-03-01 MED ORDER — GABAPENTIN 100 MG PO CAPS: 100.0000 mg | ORAL_CAPSULE | Freq: Three times a day (TID) | ORAL | 3 refills | Status: AC

## 2023-03-01 NOTE — Progress Notes (Signed)
[facility]  PATIENT: Ruben Allen DOB: 1960/02/11  REFERRING DOCTOR OR PCP: Dr. Sherwood Gambler SOURCE: Patient, notes from primary care, imaging and lab reports from recent hospitalization, MRI and CT images personally reviewed  _________________________________   HISTORICAL  CHIEF COMPLAINT:  Chief Complaint  Patient presents with   Multiple Sclerosis    Rm 11 with spouse Pt is well, reports he has been having pain and numbness in R hand. Otherwise pt is stable, no other concerns.     HISTORY OF PRESENT ILLNESS:   Update 03/01/2023: He has RRMS and he started [facility] March 2021.   He has tolerated it fairly well.  He denies any exacerbation or major new symptom but has felt worse in general and has noted more tingling.     He has had some pain and numbness in the right hand.  He notes it gripping things.   He still has some dysestheisa in feet with a painful cold sensation or pressure sensation.   He is pre-diabetic    He also notes allodynia in his face, left > right.  When he runs his finger over his face the sensation is vibrating.  This is mild and not worsening.    He has fatigue, especially in the heat .  He has retired.[employer]   He takes gabapentin at night for insomnia but has a hangover.    He snores and has had some witnessed OSA but has no interest in CPAP.    He does not doze off on weekdays but often on weekends if less active.     No outright depression but some irritable  He is on Celexa and tolerates it well.    He had cervical fusion in 2023.   He was out of work during his surgery and was reassigned a different job when he went back .  However, he would have had to be outside the whole day and was unable to do the job due to heat intolerance from the MS.  He was let go.  He also has degenerative changes in the lumbar spine.  The worst levels L4-L5 where he has left greater than right facet hypertrophy and disc bulging combining to cause moderately  severe left lateral recess stenosis with potential for left L5 nerve root compression.     MS History:   He was seen at the [facility] emergency room 08/12/2017 after presenting with left-sided facial numbness 08/05/2017.    On 08/04/17, he felt just a little numbness but when her woke up with severe numbness in the lips, inside mouth, tongue, chin and up to the left ear.   He had very mild blurry vision.    He also noted more tiredness.   He felt slightly lightheaded.   He had mild gait ataxia but no clumsiness in the arms or legs.   CT scan was normal but MRI of the brain performed 08/19/2017 showed a faintly enhancing lesion in the left middle cerebellar peduncle and one periventricular focus in the parieto-occipital periventricular white matter that was also present on the 2007 MRI.   He received 3 days of IV Solu-Medrol.  While in the hospital, he also had a CT angiogram of the neck and head and an MRI of the cervical spine.  There is no evidence of demyelination but he did have significant degenerative changes with mild to moderate spinal stenosis at C6-C7 and foraminal narrowing at most of the other levels, moderately severe at C6-C7.  The CT angiograms were normal.  He was discharged after his steroids were completed.    He did not note benefit during those 3 days in the hospital and he took the next week off.   He began to improve about a week or two later.  His fatigue was still severe. He still felt wobbly.  Symptoms have improved very slowly.   Currently he only has numbness in the corner of his mouth on the left.  Additionally the inside of his mouth feels weird.   He still has a lot of fatigue.  Eleven years ago he had vertigo x 6 months and was diagnosed with vestibular disorder,    MRI showed one PV lesion.   DMT Medications:   [name] started March 2021.  Last week, he had CBC/D, CMP and HgbA1c --- LFT, lymphocytes were fine.     IMAGING REVIEW MR brain 02/14/2023 showed Scattered  T2/FLAIR hyperintense foci in the cerebral hemispheres in the left middle cerebellar peduncle. The foci of nonspecific but could be compatible with chronic demyelinating plaque for multiple sclerosis. Some foci could also be due to chronic microvascular ischemic change. None of the foci appear to be acute. They do not enhance. No new lesions compared to the MRI from 2023.   MRI Brain 12/18/2021 showed Few T2/FLAIR hypertense foci in the left middle cerebellar peduncle and in the cerebral hemispheres. Though nonspecific, the pattern is consistent with chronic demyelinating plaque associated with multiple sclerosis. None of the foci appear to be acute. They do not enhance. No new lesions compared to the 09/04/2020 MRI.   MRI Brain 03/21/2019:  T2/flair hyperintense foci in the left middle cerebellar peduncle and in the juxtacortical and deep white matter of the hemispheres.  One focus in the right parietal lobe in the juxtacortical white matter was not present on the previous MRI and enhances on the current MRI.  This is consistent with an active demyelinating plaque.  An additional nonenhancing focus in the left posterior frontal lobe was not present on the previous MRI.  MRI 06/17/2018 showed possibly one new focus compared to 2019.    MRI cervical spine 05/02/2021 showed a normal spinal cord and C6C7 > C5C DDD and spinal stenosis  MRi 08/19/2017 and 08/20/2017 of the brain, cervical spine and the CT angiograms.  The MRI of the brain shows a focus in the left middle cerebellar peduncle/posterior pons.  There was subtle enhancement seen on the coronal postcontrast images.   The focus also appeared on diffusion-weighted images but was not hypointense on ADC maps additionally, there are a couple T2/FLAIR hyperintense foci in the hemispheres in the periventricular region parietal/occipital lobes (2 of the 3 were present on the previous MRI from 2007).   CT angiogram of the head and neck were normal.    MRI of the  cervical spine did not show any demyelination.  There is spinal stenosis at C6-C7 and bilateral foraminal narrowing.    T  he MRI from 05/05/2005 was also reviewed.  The brainstem look normal.  The cerebellum was normal.  There were 2 periventricular foci in the right parietal/occipital region  MRI of the lumbar spine 12/19/2019 was reviewed today.  It shows degenerative changes at several levels.  At L4-L5, a combination of left greater than right facet hypertrophy and disc bulging leads to moderately severe left lateral recess stenosis with potential for left L5 nerve root compression.  There is moderate lateral recess stenosis to the right with less potential  for nerve root compression.  Facet hypertrophy also noted at 3 4 and 5 1.  Labs: 08/19/2017:  Standard labs and B12 were normal or near normal with elevated glucose (he was on steroids).   REVIEW OF SYSTEMS: Constitutional: No fevers, chills, sweats, or change in appetite.  He notes fatigue Eyes: No visual changes, double vision, eye pain Ear, nose and throat: No hearing loss, ear pain, nasal congestion, sore throat Cardiovascular: No chest pain, palpitations Respiratory:  No shortness of breath at rest or with exertion.   No wheezes GastrointestinaI: No nausea, vomiting, diarrhea, abdominal pain, fecal incontinence Genitourinary:  No dysuria, urinary retention or frequency.  No nocturia. Musculoskeletal:  No neck pain, back pain Integumentary: No rash, pruritus, skin lesions Neurological: as above Psychiatric: No depression at this time.  No anxiety Endocrine: No palpitations, diaphoresis, change in appetite, change in weigh or increased thirst Hematologic/Lymphatic:  No anemia, purpura, petechiae. Allergic/Immunologic: No itchy/runny eyes, nasal congestion, recent allergic reactions, rashes  ALLERGIES: No Known Allergies  HOME MEDICATIONS:  Current Outpatient Medications:    amLODipine (NORVASC) 2.5 MG tablet, Take 5 mg by mouth  daily., Disp: , Rfl:    aspirin EC 81 MG tablet, Take 81 mg by mouth daily at 8 pm. Swallow whole. (1930), Disp: , Rfl:    Aspirin-Caffeine (BACK & BODY EXTRA STRENGTH) 500-32.5 MG TABS, Take 2 tablets by mouth 2 (two) times daily as needed (pain)., Disp: , Rfl:    dicyclomine (BENTYL) 10 MG capsule, TAKE ONE CAPSULE BY MOUTH TWICE DAILY BEFORE MEAL(S) (Patient taking differently: Take 10 mg by mouth in the morning and at bedtime.), Disp: 60 capsule, Rfl: 5   hydrochlorothiazide (HYDRODIURIL) 25 MG tablet, Take 25 mg by mouth daily., Disp: , Rfl:    ibuprofen (ADVIL) 200 MG tablet, Take 400 mg by mouth every 8 (eight) hours as needed (pain.)., Disp: , Rfl:    loratadine (CLARITIN) 10 MG tablet, Take 10 mg by mouth daily., Disp: , Rfl:    pravastatin (PRAVACHOL) 40 MG tablet, Take 40 mg by mouth daily at 8 pm. 1930, Disp: , Rfl:    vitamin B-12 (CYANOCOBALAMIN) 1000 MCG tablet, Take 1,000 mcg by mouth daily., Disp: , Rfl:    vitamin C (ASCORBIC ACID) 500 MG tablet, Take 500 mg by mouth daily., Disp: , Rfl:    gabapentin (NEURONTIN) 100 MG capsule, Take 1 capsule (100 mg total) by mouth 3 (three) times daily., Disp: 270 capsule, Rfl: 3   Teriflunomide ([name]) 14 MG TABS, Take 1 tablet (14 mg total) by mouth daily., Disp: 90 tablet, Rfl: 3  PAST MEDICAL HISTORY: Past Medical History:  Diagnosis Date   High cholesterol    Hypertension    x 5 yrs   MS (multiple sclerosis) (HCC) 03/2019   Vision abnormalities     PAST SURGICAL HISTORY: Past Surgical History:  Procedure Laterality Date   BIOPSY  08/29/2019   Procedure: BIOPSY;  Surgeon: Dolores Frame, MD;  Location: [facility];  Service: Gastroenterology;;   cardiac cath     COLONOSCOPY N/A 02/19/2013   Procedure: COLONOSCOPY;  Surgeon: Malissa Hippo, MD;  Location: [facility];  Service: Endoscopy;  Laterality: N/A;  145-moved to 320 Ann to notify pt   COLONOSCOPY WITH PROPOFOL N/A 08/29/2019   Procedure: COLONOSCOPY  WITH PROPOFOL;  Surgeon: Dolores Frame, MD;  Location: [facility];  Service: Gastroenterology;  Laterality: N/A;  730   GANGLION CYST EXCISION Left    NECK SURGERY  06/15/2021   Thumb surgery Left    trigger finger    FAMILY HISTORY: Family History  Problem Relation Age of Onset   Heart disease Mother    COPD Mother    Colon cancer Father    Other Sister        "some form of autoimmune disease"   Diabetes type II Sister    COPD Sister    Other Brother 52       Motorcycle Accident   Other Brother 31       Heroin OD   Colon cancer Brother    Multiple sclerosis Cousin    Multiple sclerosis Cousin     SOCIAL HISTORY:  Social History   Socioeconomic History   Marital status: Married    Spouse name: Not on file   Number of children: 0   Years of education: 9   Highest education level: Not on file  Occupational History   Occupation: [employer]   Tobacco Use   Smoking status: Every Day    Current packs/day: 1.00    Average packs/day: 1 pack/day for 25.0 years (25.0 ttl pk-yrs)    Types: Cigarettes   Smokeless tobacco: Never   Tobacco comments:    quit a couple of years after smoking 37yrs.  Vaping Use   Vaping status: Never Used  Substance and Sexual Activity   Alcohol use: Yes    Comment: weekends- few beers   Drug use: No   Sexual activity: Yes  Other Topics Concern   Not on file  Social History Narrative   Right handed    Coffee daily   Social Drivers of Health   Financial Resource Strain: Not on file  Food Insecurity: Not on file  Transportation Needs: Not on file  Physical Activity: Not on file  Stress: Not on file  Social Connections: Not on file  Intimate Partner Violence: Not on file     PHYSICAL EXAM  Vitals:   03/01/23 1427 03/01/23 1430  BP: (!) 174/78 (!) 158/79  Pulse: 87 87  Weight: 179 lb (81.2 kg)   Height: 5\' 8"  (1.727 m)     Body mass index is 27.22 kg/m.   General: The patient is well-developed and  well-nourished and in no acute distress   Neurologic Exam  Mental status: The patient is alert and oriented x 3 at the time of the examination. The patient has apparent normal recent and remote memory, with an apparently normal attention span and concentration ability.   Speech is normal.  Cranial nerves: Extraocular movements are full.  Facial strength was normal.  Reduced left facial sensation.  Trapezius strength was normal.  No obvious hearing deficits are noted.  Motor:  Muscle bulk is normal.   Tone is normal. Strength is  5 / 5 in all 4 extremities except 4+/5 in the right triceps.  Sensory: Intact to touch and vibration sensation in the arms but mildly reduced vibration at toes and reduced pinprick ankles down, worse at toes.     Coordination: Cerebellar testing reveals good finger-nose-finger and mildly reduced heel-to-shin bilaterally.  Gait and station: Station is normal.   Gait is mildly wide.  Tandem gait is wide..  Romberg is negative.  Reflexes: Deep tendon reflexes are symmetric and normal in arms and increased with legs with mild crossed adductor responses,.       DIAGNOSTIC DATA (LABS, IMAGING, TESTING) - I reviewed patient records, labs, notes, testing and imaging myself where available.  Lab Results  Component Value Date   WBC 6.5 11/16/2021   HGB 14.7 11/16/2021   HCT 41.8 11/16/2021   MCV 87 11/16/2021   PLT 235 11/16/2021      Component Value Date/Time   NA 140 02/11/2020 1509   K 3.7 02/11/2020 1509   CL 101 02/11/2020 1509   CO2 25 02/11/2020 1509   GLUCOSE 105 (H) 02/11/2020 1509   GLUCOSE 139 (H) 08/27/2019 1443   BUN 21 02/11/2020 1509   CREATININE 1.13 02/11/2020 1509   CALCIUM 9.7 02/11/2020 1509   PROT 7.1 11/16/2021 1515   ALBUMIN 5.1 (H) 11/16/2021 1515   ALBUMIN 4.8 10/26/2017 0920   AST 15 11/16/2021 1515   ALT 21 11/16/2021 1515   ALKPHOS 59 11/16/2021 1515   BILITOT 0.3 11/16/2021 1515   GFRNONAA 71 02/11/2020 1509   GFRAA 82  02/11/2020 1509   Lab Results  Component Value Date   CHOL 189 08/20/2017   HDL 60 08/20/2017   LDLCALC 117 (H) 08/20/2017   TRIG 58 08/20/2017   CHOLHDL 3.2 08/20/2017   Lab Results  Component Value Date   HGBA1C 5.8 (H) 08/20/2017   Lab Results  Component Value Date   VITAMINB12 1,018 02/11/2020   No results found for: "TSH"     ASSESSMENT AND PLAN  High risk medication use - Plan: CBC with Differential/Platelet, Comprehensive metabolic panel  Multiple sclerosis (HCC) - Plan: Teriflunomide ([name]) 14 MG TABS, CBC with Differential/Platelet, Comprehensive metabolic panel, DISCONTINUED: Teriflunomide ([name]) 14 MG TABS  Dysesthesia  Gait disturbance  History of fusion of cervical spine   1.   Continue [name].  Recent brain MRI shows no new lesions.  Check lab work   2.   tay active and exercise as tolerated.    3.   Likely has mild polyneuropathy with pattern c/w diabetic.   If worsens, consider increasing gabapentin o starting lamotrigine 4.   He will return to see me in 6 months .   Koren Sermersheim A. Epimenio Foot, MD, PhD, FAAN Certified in Neurology, Clinical Neurophysiology, Sleep Medicine, Pain Medicine and Neuroimaging Director, [facility]  [facility] [address], [address] [place], [place] [address] [phone]

## 2023-03-02 ENCOUNTER — Encounter: Payer: Self-pay | Admitting: Neurology

## 2023-03-02 LAB — CBC WITH DIFFERENTIAL/PLATELET
Basophils Absolute: 0 10*3/uL (ref 0.0–0.2)
Basos: 1 %
EOS (ABSOLUTE): 0.4 10*3/uL (ref 0.0–0.4)
Eos: 6 %
Hematocrit: 40.5 % (ref 37.5–51.0)
Hemoglobin: 13.6 g/dL (ref 13.0–17.7)
Immature Grans (Abs): 0 10*3/uL (ref 0.0–0.1)
Immature Granulocytes: 1 %
Lymphocytes Absolute: 1.5 10*3/uL (ref 0.7–3.1)
Lymphs: 24 %
MCH: 30.4 pg (ref 26.6–33.0)
MCHC: 33.6 g/dL (ref 31.5–35.7)
MCV: 90 fL (ref 79–97)
Monocytes Absolute: 0.7 10*3/uL (ref 0.1–0.9)
Monocytes: 11 %
Neutrophils Absolute: 3.5 10*3/uL (ref 1.4–7.0)
Neutrophils: 57 %
Platelets: 238 10*3/uL (ref 150–450)
RBC: 4.48 x10E6/uL (ref 4.14–5.80)
RDW: 13.3 % (ref 11.6–15.4)
WBC: 6.2 10*3/uL (ref 3.4–10.8)

## 2023-03-02 LAB — COMPREHENSIVE METABOLIC PANEL
ALT: 19 [IU]/L (ref 0–44)
AST: 20 [IU]/L (ref 0–40)
Albumin: 4.4 g/dL (ref 3.9–4.9)
Alkaline Phosphatase: 65 [IU]/L (ref 44–121)
BUN/Creatinine Ratio: 19 (ref 10–24)
BUN: 20 mg/dL (ref 8–27)
Bilirubin Total: 0.2 mg/dL (ref 0.0–1.2)
CO2: 24 mmol/L (ref 20–29)
Calcium: 9.2 mg/dL (ref 8.6–10.2)
Chloride: 102 mmol/L (ref 96–106)
Creatinine, Ser: 1.07 mg/dL (ref 0.76–1.27)
Globulin, Total: 2.2 g/dL (ref 1.5–4.5)
Glucose: 134 mg/dL — ABNORMAL HIGH (ref 70–99)
Potassium: 3.9 mmol/L (ref 3.5–5.2)
Sodium: 141 mmol/L (ref 134–144)
Total Protein: 6.6 g/dL (ref 6.0–8.5)
eGFR: 78 mL/min/{1.73_m2} (ref >59)

## 2023-07-10 LAB — LAB REPORT - SCANNED
Albumin, Urine POC: 37.6
Creatinine, POC: 265.2 mg/dL
EGFR: 83

## 2023-08-18 ENCOUNTER — Encounter: Payer: Self-pay | Admitting: Neurology

## 2023-08-18 DIAGNOSIS — G35 Multiple sclerosis: Secondary | ICD-10-CM

## 2023-08-20 MED ORDER — TERIFLUNOMIDE 14 MG PO TABS: 1.0000 | ORAL_TABLET | Freq: Every day | ORAL | 0 refills | Status: AC

## 2023-09-11 ENCOUNTER — Telehealth: Payer: Self-pay | Admitting: Neurology

## 2023-09-11 NOTE — Telephone Encounter (Signed)
 Patient requested to reschedule appointment 6 months out due to not having any issues right now. Patient's wife stated he will call back if have any issues. Rescheduled appointment.

## 2023-09-13 ENCOUNTER — Ambulatory Visit: Payer: No Typology Code available for payment source | Admitting: Neurology

## 2023-11-28 ENCOUNTER — Other Ambulatory Visit (HOSPITAL_COMMUNITY): Payer: Self-pay | Admitting: Internal Medicine

## 2023-11-28 DIAGNOSIS — Z87891 Personal history of nicotine dependence: Secondary | ICD-10-CM

## 2023-12-24 ENCOUNTER — Ambulatory Visit (HOSPITAL_COMMUNITY): Admission: RE | Admit: 2023-12-24 | Discharge: 2023-12-24 | Attending: Internal Medicine | Admitting: Internal Medicine

## 2023-12-24 DIAGNOSIS — Z87891 Personal history of nicotine dependence: Secondary | ICD-10-CM

## 2024-01-07 ENCOUNTER — Ambulatory Visit (HOSPITAL_COMMUNITY)

## 2024-04-15 ENCOUNTER — Ambulatory Visit: Admitting: Neurology
